# Patient Record
Sex: Female | Born: 1935
Health system: Southern US, Community
[De-identification: ages and names within clinical notes are randomized; demographics above are authoritative.]

## PROBLEM LIST (undated history)

## (undated) DIAGNOSIS — D649 Anemia, unspecified: Secondary | ICD-10-CM

## (undated) DIAGNOSIS — D259 Leiomyoma of uterus, unspecified: Secondary | ICD-10-CM

## (undated) DIAGNOSIS — M8580 Other specified disorders of bone density and structure, unspecified site: Secondary | ICD-10-CM

## (undated) DIAGNOSIS — I81 Portal vein thrombosis: Secondary | ICD-10-CM

## (undated) DIAGNOSIS — M1990 Unspecified osteoarthritis, unspecified site: Secondary | ICD-10-CM

## (undated) DIAGNOSIS — K859 Acute pancreatitis without necrosis or infection, unspecified: Secondary | ICD-10-CM

## (undated) DIAGNOSIS — K439 Ventral hernia without obstruction or gangrene: Secondary | ICD-10-CM

## (undated) DIAGNOSIS — I10 Essential (primary) hypertension: Secondary | ICD-10-CM

## (undated) DIAGNOSIS — Z87828 Personal history of other (healed) physical injury and trauma: Secondary | ICD-10-CM

## (undated) DIAGNOSIS — K5790 Diverticulosis of intestine, part unspecified, without perforation or abscess without bleeding: Secondary | ICD-10-CM

## (undated) DIAGNOSIS — E440 Moderate protein-calorie malnutrition: Secondary | ICD-10-CM

## (undated) HISTORY — DX: Portal vein thrombosis: I81

## (undated) HISTORY — DX: Acute pancreatitis without necrosis or infection, unspecified: K85.90

## (undated) HISTORY — DX: Diverticulosis of intestine, part unspecified, without perforation or abscess without bleeding: K57.90

## (undated) HISTORY — DX: Ventral hernia without obstruction or gangrene: K43.9

## (undated) HISTORY — DX: Leiomyoma of uterus, unspecified: D25.9

## (undated) HISTORY — PX: APPENDECTOMY: SHX54

## (undated) HISTORY — DX: Moderate protein-calorie malnutrition: E44.0

## (undated) HISTORY — DX: Essential (primary) hypertension: I10

## (undated) HISTORY — DX: Personal history of other (healed) physical injury and trauma: Z87.828

## (undated) HISTORY — DX: Unspecified osteoarthritis, unspecified site: M19.90

## (undated) HISTORY — DX: Other specified disorders of bone density and structure, unspecified site: M85.80

## (undated) HISTORY — PX: HERNIA REPAIR: SHX51

## (undated) HISTORY — DX: Anemia, unspecified: D64.9

---

## 2000-04-01 ENCOUNTER — Emergency Department (HOSPITAL_COMMUNITY): Admission: EM | Admit: 2000-04-01 | Discharge: 2000-04-01 | Payer: Self-pay | Admitting: Emergency Medicine

## 2002-08-29 ENCOUNTER — Encounter: Payer: Self-pay | Admitting: Internal Medicine

## 2002-08-29 ENCOUNTER — Inpatient Hospital Stay (HOSPITAL_COMMUNITY): Admission: EM | Admit: 2002-08-29 | Discharge: 2002-09-24 | Payer: Self-pay | Admitting: Emergency Medicine

## 2002-08-29 ENCOUNTER — Encounter: Payer: Self-pay | Admitting: Emergency Medicine

## 2002-08-30 ENCOUNTER — Encounter: Payer: Self-pay | Admitting: Internal Medicine

## 2002-08-31 ENCOUNTER — Encounter: Payer: Self-pay | Admitting: Internal Medicine

## 2002-09-03 ENCOUNTER — Encounter: Payer: Self-pay | Admitting: Internal Medicine

## 2002-09-04 ENCOUNTER — Encounter: Payer: Self-pay | Admitting: Internal Medicine

## 2002-09-06 ENCOUNTER — Encounter: Payer: Self-pay | Admitting: Internal Medicine

## 2002-09-07 ENCOUNTER — Encounter: Payer: Self-pay | Admitting: Internal Medicine

## 2002-09-10 ENCOUNTER — Encounter: Payer: Self-pay | Admitting: General Surgery

## 2002-09-11 ENCOUNTER — Encounter: Payer: Self-pay | Admitting: General Surgery

## 2002-09-15 ENCOUNTER — Encounter: Payer: Self-pay | Admitting: Internal Medicine

## 2002-09-16 ENCOUNTER — Encounter: Payer: Self-pay | Admitting: Internal Medicine

## 2002-09-24 ENCOUNTER — Inpatient Hospital Stay: Admission: RE | Admit: 2002-09-24 | Discharge: 2002-09-28 | Payer: Self-pay | Admitting: Internal Medicine

## 2002-09-28 ENCOUNTER — Ambulatory Visit (HOSPITAL_COMMUNITY): Admission: RE | Admit: 2002-09-28 | Discharge: 2002-09-28 | Payer: Self-pay | Admitting: Internal Medicine

## 2002-09-28 ENCOUNTER — Encounter: Payer: Self-pay | Admitting: Internal Medicine

## 2002-10-01 ENCOUNTER — Encounter: Admission: RE | Admit: 2002-10-01 | Discharge: 2002-10-01 | Payer: Self-pay | Admitting: Internal Medicine

## 2002-10-08 ENCOUNTER — Encounter: Admission: RE | Admit: 2002-10-08 | Discharge: 2002-10-08 | Payer: Self-pay | Admitting: Internal Medicine

## 2002-10-22 ENCOUNTER — Encounter: Admission: RE | Admit: 2002-10-22 | Discharge: 2002-10-22 | Payer: Self-pay | Admitting: Internal Medicine

## 2002-11-02 ENCOUNTER — Emergency Department (HOSPITAL_COMMUNITY): Admission: EM | Admit: 2002-11-02 | Discharge: 2002-11-02 | Payer: Self-pay

## 2002-11-02 ENCOUNTER — Encounter: Admission: RE | Admit: 2002-11-02 | Discharge: 2002-11-02 | Payer: Self-pay | Admitting: Infectious Diseases

## 2002-11-04 ENCOUNTER — Encounter: Admission: RE | Admit: 2002-11-04 | Discharge: 2002-11-04 | Payer: Self-pay | Admitting: Internal Medicine

## 2002-11-16 ENCOUNTER — Encounter: Admission: RE | Admit: 2002-11-16 | Discharge: 2002-11-16 | Payer: Self-pay | Admitting: Internal Medicine

## 2002-12-14 ENCOUNTER — Encounter: Admission: RE | Admit: 2002-12-14 | Discharge: 2002-12-14 | Payer: Self-pay | Admitting: Internal Medicine

## 2003-01-11 ENCOUNTER — Encounter: Admission: RE | Admit: 2003-01-11 | Discharge: 2003-01-11 | Payer: Self-pay | Admitting: Internal Medicine

## 2003-02-08 ENCOUNTER — Encounter: Admission: RE | Admit: 2003-02-08 | Discharge: 2003-02-08 | Payer: Self-pay | Admitting: Internal Medicine

## 2003-03-01 ENCOUNTER — Encounter: Admission: RE | Admit: 2003-03-01 | Discharge: 2003-03-01 | Payer: Self-pay | Admitting: Infectious Diseases

## 2003-04-05 ENCOUNTER — Encounter: Admission: RE | Admit: 2003-04-05 | Discharge: 2003-04-05 | Payer: Self-pay | Admitting: Infectious Diseases

## 2003-04-26 ENCOUNTER — Encounter: Admission: RE | Admit: 2003-04-26 | Discharge: 2003-04-26 | Payer: Self-pay | Admitting: Internal Medicine

## 2004-06-28 ENCOUNTER — Encounter (INDEPENDENT_AMBULATORY_CARE_PROVIDER_SITE_OTHER): Payer: Self-pay | Admitting: Infectious Diseases

## 2005-05-03 ENCOUNTER — Ambulatory Visit: Payer: Self-pay | Admitting: Internal Medicine

## 2005-07-11 ENCOUNTER — Ambulatory Visit: Payer: Self-pay | Admitting: Internal Medicine

## 2005-07-31 ENCOUNTER — Ambulatory Visit: Payer: Self-pay | Admitting: Internal Medicine

## 2006-06-04 ENCOUNTER — Encounter (INDEPENDENT_AMBULATORY_CARE_PROVIDER_SITE_OTHER): Payer: Self-pay | Admitting: Infectious Diseases

## 2006-06-04 DIAGNOSIS — I10 Essential (primary) hypertension: Secondary | ICD-10-CM

## 2006-07-23 DIAGNOSIS — I81 Portal vein thrombosis: Secondary | ICD-10-CM

## 2006-07-23 DIAGNOSIS — K439 Ventral hernia without obstruction or gangrene: Secondary | ICD-10-CM | POA: Insufficient documentation

## 2006-07-23 DIAGNOSIS — K630 Abscess of intestine: Secondary | ICD-10-CM

## 2006-07-23 DIAGNOSIS — M949 Disorder of cartilage, unspecified: Secondary | ICD-10-CM

## 2006-07-23 DIAGNOSIS — A419 Sepsis, unspecified organism: Secondary | ICD-10-CM

## 2006-07-23 DIAGNOSIS — M899 Disorder of bone, unspecified: Secondary | ICD-10-CM

## 2012-11-04 ENCOUNTER — Telehealth: Payer: Self-pay | Admitting: Physician Assistant

## 2012-11-04 MED ORDER — HYDROCHLOROTHIAZIDE 25 MG PO TABS: 25.0000 mg | ORAL_TABLET | Freq: Every day | ORAL | Status: DC

## 2012-11-04 NOTE — Telephone Encounter (Signed)
Med refill #30 only.  Pt NTBS.  Called pt home and left mess to make appt.

## 2012-12-01 ENCOUNTER — Telehealth: Payer: Self-pay | Admitting: Physician Assistant

## 2012-12-02 ENCOUNTER — Telehealth: Payer: Self-pay | Admitting: Physician Assistant

## 2012-12-02 MED ORDER — HYDROCHLOROTHIAZIDE 25 MG PO TABS: 25.0000 mg | ORAL_TABLET | Freq: Every day | ORAL | Status: DC

## 2012-12-02 NOTE — Telephone Encounter (Signed)
HCTZ 25mg  take one tablet by mouth every day. #30 last refill 11/04/12 pt has appt next week i went ahead and refilled prescription

## 2012-12-05 ENCOUNTER — Encounter: Payer: Self-pay | Admitting: Physician Assistant

## 2012-12-18 ENCOUNTER — Ambulatory Visit (INDEPENDENT_AMBULATORY_CARE_PROVIDER_SITE_OTHER): Payer: Medicare HMO | Admitting: Physician Assistant

## 2012-12-18 ENCOUNTER — Other Ambulatory Visit: Payer: Self-pay | Admitting: Physician Assistant

## 2012-12-18 ENCOUNTER — Encounter: Payer: Self-pay | Admitting: Physician Assistant

## 2012-12-18 VITALS — BP 160/82 | HR 76 | Temp 98.3°F | Resp 18 | Ht 62.25 in | Wt 194.0 lb

## 2012-12-18 DIAGNOSIS — M899 Disorder of bone, unspecified: Secondary | ICD-10-CM

## 2012-12-18 DIAGNOSIS — I10 Essential (primary) hypertension: Secondary | ICD-10-CM

## 2012-12-18 DIAGNOSIS — M170 Bilateral primary osteoarthritis of knee: Secondary | ICD-10-CM

## 2012-12-18 DIAGNOSIS — M8580 Other specified disorders of bone density and structure, unspecified site: Secondary | ICD-10-CM | POA: Insufficient documentation

## 2012-12-18 DIAGNOSIS — M1710 Unilateral primary osteoarthritis, unspecified knee: Secondary | ICD-10-CM

## 2012-12-18 DIAGNOSIS — E785 Hyperlipidemia, unspecified: Secondary | ICD-10-CM

## 2012-12-18 DIAGNOSIS — Z2911 Encounter for prophylactic immunotherapy for respiratory syncytial virus (RSV): Secondary | ICD-10-CM

## 2012-12-18 DIAGNOSIS — R5383 Other fatigue: Secondary | ICD-10-CM

## 2012-12-18 DIAGNOSIS — E559 Vitamin D deficiency, unspecified: Secondary | ICD-10-CM

## 2012-12-18 DIAGNOSIS — Z23 Encounter for immunization: Secondary | ICD-10-CM

## 2012-12-18 LAB — CBC WITH DIFFERENTIAL/PLATELET
Basophils Absolute: 0 10*3/uL (ref 0.0–0.1)
Eosinophils Absolute: 0.3 10*3/uL (ref 0.0–0.7)
Eosinophils Relative: 5 % (ref 0–5)
MCH: 32.3 pg (ref 26.0–34.0)
MCHC: 34.6 g/dL (ref 30.0–36.0)
MCV: 93.5 fL (ref 78.0–100.0)
Platelets: 263 10*3/uL (ref 150–400)
RDW: 12.9 % (ref 11.5–15.5)

## 2012-12-18 LAB — COMPLETE METABOLIC PANEL WITH GFR
ALT: 23 U/L (ref 0–35)
AST: 21 U/L (ref 0–37)
Creat: 0.71 mg/dL (ref 0.50–1.10)
GFR, Est African American: >89 mL/min
Total Bilirubin: 0.8 mg/dL (ref 0.3–1.2)

## 2012-12-18 LAB — LIPID PANEL
Cholesterol: 152 mg/dL (ref 0–200)
HDL: 37 mg/dL — ABNORMAL LOW (ref >39)
LDL Cholesterol: 93 mg/dL (ref 0–99)
Triglycerides: 112 mg/dL (ref <150)
VLDL: 22 mg/dL (ref 0–40)

## 2012-12-18 MED ORDER — HYDROCHLOROTHIAZIDE 25 MG PO TABS: 25.0000 mg | ORAL_TABLET | Freq: Every day | ORAL | Status: DC

## 2012-12-18 MED ORDER — LISINOPRIL 10 MG PO TABS: 10.0000 mg | ORAL_TABLET | Freq: Every day | ORAL | Status: DC
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
## 2012-12-19 LAB — HEMOGLOBIN A1C: Mean Plasma Glucose: 126 mg/dL — ABNORMAL HIGH (ref <117)

## 2012-12-22 ENCOUNTER — Telehealth: Payer: Self-pay | Admitting: Physician Assistant

## 2012-12-22 NOTE — Telephone Encounter (Signed)
Pt called and told about lab results and given provider directions.  Also told to make 3 mth follow up appt.

## 2012-12-22 NOTE — Telephone Encounter (Signed)
Message copied by Donne Anon on Mon Dec 22, 2012  3:32 PM ------      Message from: Allayne Butcher      Created: Fri Dec 19, 2012  5:41 PM       Her Sugar was a little high this time (no h/o this). Tell her to decrease carbohydrates- Sweets, Potatoes, Pasta, Rice, Fruit, Juice, etc. MUST come for f/u OV 3 months so I can recheck lab and will give her Carbohydrate information then. If A1C does not decrease, will also require cholesterol med.      Rest of labs are all normal. -CBC, CMET, TSH, Vit D ------

## 2013-01-06 NOTE — Telephone Encounter (Signed)
Was done 12/01/12

## 2013-07-30 ENCOUNTER — Encounter: Payer: Self-pay | Admitting: Family Medicine

## 2013-11-10 ENCOUNTER — Other Ambulatory Visit: Payer: Self-pay | Admitting: Physician Assistant

## 2013-11-10 ENCOUNTER — Encounter: Payer: Self-pay | Admitting: Family Medicine

## 2013-11-10 DIAGNOSIS — I10 Essential (primary) hypertension: Secondary | ICD-10-CM

## 2013-11-10 NOTE — Telephone Encounter (Signed)
Medication refill for one time only.  Patient needs to be seen.  Letter sent for patient to call and schedule 

## 2014-01-06 ENCOUNTER — Other Ambulatory Visit: Payer: Self-pay | Admitting: Physician Assistant

## 2014-01-06 ENCOUNTER — Telehealth: Payer: Self-pay | Admitting: Physician Assistant

## 2014-01-06 NOTE — Telephone Encounter (Signed)
[phone]  Pharmacy is [facility]  Pt is needing a refill on lisinopril (PRINIVIL,ZESTRIL) 10 MG tablet

## 2014-01-06 NOTE — Telephone Encounter (Signed)
Rf already done on escribe from pharmacy

## 2014-01-06 NOTE — Telephone Encounter (Signed)
Refill appropriate and filled per protocol. 

## 2014-01-25 ENCOUNTER — Telehealth: Payer: Self-pay | Admitting: Physician Assistant

## 2014-01-25 ENCOUNTER — Other Ambulatory Visit: Payer: Self-pay | Admitting: Physician Assistant

## 2014-01-25 NOTE — Telephone Encounter (Signed)
[phone]  Pt is needing a refill on hydrochlorothiazide (HYDRODIURIL) 25 MG tablet  Pharmacy [facility]   ---pt states she will make an apt but there is a lot going on with her family she is needing to get all of that taken care of.

## 2014-01-25 NOTE — Telephone Encounter (Signed)
Call placed to patient. LM on VM.   Letter sent.

## 2014-01-25 NOTE — Telephone Encounter (Signed)
?  ok to refill last ov 12/18/2013 stated on medlist "do not refill until pt is seen".

## 2014-01-25 NOTE — Telephone Encounter (Signed)
Patient returned call and made aware.   Appointment scheduled for 02/17/2014 @ 8:30am.

## 2014-01-25 NOTE — Telephone Encounter (Signed)
Medication filled x1 with no refills.   Requires office visit before any further refills can be given.   Letter sent.  

## 2014-01-25 NOTE — Telephone Encounter (Signed)
Her last office visit in this office was May 2014. At that visit an additional blood pressure medication was added because her blood pressure was uncontrolled. She has had no followup since that time. She is 78 years old. It is not safe for [place] to continue prescribing medications without monitoring them at all. Reviewed that at the last note she was having a lot of stress with taking care of family members. However, if she is not taking care of her own health, she is not going to be any good to herself or anyone else. She needs to find a way to take 1 or 2 hours out of her time to come in for an office visit and lab work. IF she schedules an office visit to be in the next few weeks, THEN  we can give her one month supply of medications to hold her until that. NO REFILLS!

## 2014-02-17 ENCOUNTER — Ambulatory Visit: Payer: Self-pay | Admitting: Physician Assistant

## 2014-02-23 ENCOUNTER — Telehealth: Payer: Self-pay | Admitting: Physician Assistant

## 2014-02-23 NOTE — Telephone Encounter (Signed)
Patient is calling to see if she needs to fast on her appt on 08-10 with mary beth and also wants to know if we think she has enough blood pressure medication until her appointment  Please call her at [phone]

## 2014-02-24 ENCOUNTER — Ambulatory Visit: Payer: Medicare HMO | Admitting: Physician Assistant

## 2014-02-24 ENCOUNTER — Other Ambulatory Visit: Payer: Self-pay | Admitting: Physician Assistant

## 2014-03-01 ENCOUNTER — Telehealth: Payer: Self-pay | Admitting: Physician Assistant

## 2014-03-01 ENCOUNTER — Other Ambulatory Visit: Payer: Self-pay | Admitting: Physician Assistant

## 2014-03-01 ENCOUNTER — Ambulatory Visit: Payer: Medicare HMO | Admitting: Physician Assistant

## 2014-03-01 NOTE — Telephone Encounter (Signed)
Ok to refill 

## 2014-03-01 NOTE — Telephone Encounter (Signed)
Last office visit was greater than one year ago. However, patient has appointment scheduled for 03/03/14. OK to refill to hold her over to the appointment. Give #30 with 0 additional refills.

## 2014-03-01 NOTE — Telephone Encounter (Signed)
[phone]   PT is needing a refill on hydrochlorothiazide (HYDRODIURIL) 25 MG tablet   Pharmacy [facility]   -pt had to reschedule to Wednesday 8/12 but she just took her last pill this morning can we please get some more sent to the pharmacy.

## 2014-03-01 NOTE — Telephone Encounter (Signed)
Prescription called in to pharmacy, 30 no refills, pt has appt on 03/03/14

## 2014-03-01 NOTE — Telephone Encounter (Signed)
I Already responded to this in a different section of InBasket.  Responded that we can give her #30+0 additional refills

## 2014-03-03 ENCOUNTER — Telehealth: Payer: Self-pay | Admitting: Physician Assistant

## 2014-03-03 ENCOUNTER — Encounter: Payer: Self-pay | Admitting: Physician Assistant

## 2014-03-03 ENCOUNTER — Ambulatory Visit (INDEPENDENT_AMBULATORY_CARE_PROVIDER_SITE_OTHER): Payer: Medicare HMO | Admitting: Physician Assistant

## 2014-03-03 VITALS — BP 140/70 | HR 84 | Temp 98.1°F | Resp 18 | Ht 62.25 in | Wt 200.0 lb

## 2014-03-03 DIAGNOSIS — Z1211 Encounter for screening for malignant neoplasm of colon: Secondary | ICD-10-CM

## 2014-03-03 DIAGNOSIS — I10 Essential (primary) hypertension: Secondary | ICD-10-CM

## 2014-03-03 DIAGNOSIS — M899 Disorder of bone, unspecified: Secondary | ICD-10-CM

## 2014-03-03 DIAGNOSIS — M8580 Other specified disorders of bone density and structure, unspecified site: Secondary | ICD-10-CM

## 2014-03-03 DIAGNOSIS — E669 Obesity, unspecified: Secondary | ICD-10-CM

## 2014-03-03 DIAGNOSIS — Z1212 Encounter for screening for malignant neoplasm of rectum: Secondary | ICD-10-CM

## 2014-03-03 DIAGNOSIS — R739 Hyperglycemia, unspecified: Secondary | ICD-10-CM | POA: Insufficient documentation

## 2014-03-03 DIAGNOSIS — M949 Disorder of cartilage, unspecified: Secondary | ICD-10-CM

## 2014-03-03 DIAGNOSIS — Z23 Encounter for immunization: Secondary | ICD-10-CM

## 2014-03-03 DIAGNOSIS — R7309 Other abnormal glucose: Secondary | ICD-10-CM

## 2014-03-03 LAB — BASIC METABOLIC PANEL WITH GFR
BUN: 18 mg/dL (ref 6–23)
CO2: 27 mEq/L (ref 19–32)
CREATININE: 0.85 mg/dL (ref 0.50–1.10)
Calcium: 9.4 mg/dL (ref 8.4–10.5)
Chloride: 99 mEq/L (ref 96–112)
GFR, EST NON AFRICAN AMERICAN: 67 mL/min
GFR, Est African American: 77 mL/min
GLUCOSE: 101 mg/dL — AB (ref 70–99)
Potassium: 4.4 mEq/L (ref 3.5–5.3)
Sodium: 136 mEq/L (ref 135–145)

## 2014-03-03 LAB — HEMOGLOBIN A1C
Hgb A1c MFr Bld: 6.2 % — ABNORMAL HIGH (ref <5.7)
Mean Plasma Glucose: 131 mg/dL — ABNORMAL HIGH (ref <117)

## 2014-03-03 NOTE — Telephone Encounter (Signed)
Patient is calling to ask how many calories she can have per day she said she forgot to ask mary beth

## 2014-03-03 NOTE — Telephone Encounter (Signed)
Contacted pt and she was wanting to know how much salt intake and calories she needs to consume, stated that MaryBeth mentioned it, I looked at office visit notes and nothing was mentioned about her salt intake or calories. Pt states she is going to watch what she eats and will call back if any more questjons
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
## 2014-03-04 NOTE — Telephone Encounter (Signed)
Med refilled.

## 2014-03-08 ENCOUNTER — Telehealth: Payer: Self-pay | Admitting: Physician Assistant

## 2014-03-08 MED ORDER — HYDROCHLOROTHIAZIDE 25 MG PO TABS: ORAL_TABLET | ORAL | Status: DC

## 2014-03-08 MED ORDER — LISINOPRIL 10 MG PO TABS: ORAL_TABLET | ORAL | Status: DC

## 2014-03-08 NOTE — Telephone Encounter (Signed)
Refill appropriate and filled per protocol. 

## 2014-03-08 NOTE — Telephone Encounter (Signed)
Patient is calling to let us know that she would like 90 day supply on her blood pressure medications lisinopril and hydrochlorothiazide if possible

## 2014-04-07 NOTE — Progress Notes (Signed)
Pt was to do HemeOccult x 3--These are now showing up in "Overdue Results" --Please call pt and remind her to do these.  If she is NOT going to do these, let me know so I can remove them from my inbasket.

## 2014-04-13 NOTE — Progress Notes (Signed)
In my"Overdue Results" part of Inbasket I am seeing that this patient has not come in to do her fecal occult test.  Call patient to remind her to do these. If she plans not to do these, please let me know so I can cancel the order.

## 2014-04-27 ENCOUNTER — Ambulatory Visit (INDEPENDENT_AMBULATORY_CARE_PROVIDER_SITE_OTHER): Payer: Medicare HMO | Admitting: Family Medicine

## 2014-04-27 DIAGNOSIS — Z23 Encounter for immunization: Secondary | ICD-10-CM

## 2014-04-28 ENCOUNTER — Telehealth: Payer: Self-pay | Admitting: *Deleted

## 2014-04-28 NOTE — Telephone Encounter (Signed)
Received call from patient.   Reports that she was given the influenza and pneumococcal vaccinations on 04/27/2014.  States that both her arms at injection site are red and tender.   Advised that injection site soreness is a normal side effect and that she can use OTC NSAID's and ICE for inflammation and pain relief.   Advised that if areas are not any better in 48 hours to contact our office.

## 2014-06-03 ENCOUNTER — Ambulatory Visit: Payer: Medicare HMO | Admitting: Physician Assistant

## 2014-06-14 ENCOUNTER — Other Ambulatory Visit: Payer: Self-pay | Admitting: Physician Assistant

## 2014-06-14 NOTE — Telephone Encounter (Signed)
Medication refilled per protocol. 

## 2014-06-16 ENCOUNTER — Other Ambulatory Visit: Payer: Self-pay | Admitting: Physician Assistant

## 2014-06-16 NOTE — Telephone Encounter (Signed)
Medication refilled per protocol. 

## 2014-06-24 ENCOUNTER — Ambulatory Visit: Payer: Medicare HMO | Admitting: Physician Assistant

## 2014-06-28 ENCOUNTER — Ambulatory Visit: Payer: Medicare HMO | Admitting: Physician Assistant

## 2014-09-12 ENCOUNTER — Other Ambulatory Visit: Payer: Self-pay | Admitting: Physician Assistant

## 2014-09-13 ENCOUNTER — Other Ambulatory Visit: Payer: Self-pay | Admitting: Family Medicine

## 2014-09-13 ENCOUNTER — Other Ambulatory Visit: Payer: Self-pay | Admitting: Physician Assistant

## 2014-09-13 ENCOUNTER — Telehealth: Payer: Self-pay | Admitting: Physician Assistant

## 2014-09-13 ENCOUNTER — Encounter: Payer: Self-pay | Admitting: Family Medicine

## 2014-09-13 MED ORDER — HYDROCHLOROTHIAZIDE 25 MG PO TABS: 25.0000 mg | ORAL_TABLET | Freq: Every day | ORAL | Status: DC

## 2014-09-13 MED ORDER — LISINOPRIL 10 MG PO TABS: 10.0000 mg | ORAL_TABLET | Freq: Every day | ORAL | Status: DC

## 2014-09-13 NOTE — Telephone Encounter (Signed)
Medication refill for one time only.  Patient needs to be seen.  Letter sent for patient to call and schedule 

## 2014-09-13 NOTE — Telephone Encounter (Signed)
[phone] [facility] [address] Pt is needing refill on   hydrochlorothiazide (HYDRODIURIL) 25 MG tablet   lisinopril (PRINIVIL,ZESTRIL) 10 MG tablet

## 2014-09-13 NOTE — Telephone Encounter (Signed)
Refill appropriate and filled per protocol. 

## 2014-09-13 NOTE — Telephone Encounter (Signed)
error 

## 2014-09-17 ENCOUNTER — Encounter: Payer: Self-pay | Admitting: Family Medicine

## 2014-09-17 DIAGNOSIS — Z1231 Encounter for screening mammogram for malignant neoplasm of breast: Secondary | ICD-10-CM | POA: Diagnosis not present

## 2014-09-17 LAB — HM MAMMOGRAPHY: HM MAMMO: NORMAL

## 2014-09-27 ENCOUNTER — Telehealth: Payer: Self-pay | Admitting: Physician Assistant

## 2014-09-27 NOTE — Telephone Encounter (Signed)
Called pt left message Mammo report was rec'd and was normal

## 2014-09-27 NOTE — Telephone Encounter (Signed)
Patient is calling to see if we have her mammogram results  [phone]

## 2014-11-04 ENCOUNTER — Ambulatory Visit: Payer: Medicare HMO | Admitting: Physician Assistant

## 2014-11-10 ENCOUNTER — Encounter: Payer: Self-pay | Admitting: Physician Assistant

## 2014-11-10 ENCOUNTER — Ambulatory Visit (INDEPENDENT_AMBULATORY_CARE_PROVIDER_SITE_OTHER): Payer: Commercial Managed Care - HMO | Admitting: Physician Assistant

## 2014-11-10 VITALS — BP 144/96 | HR 80 | Temp 98.4°F | Resp 18 | Wt 186.0 lb

## 2014-11-10 DIAGNOSIS — I10 Essential (primary) hypertension: Secondary | ICD-10-CM | POA: Diagnosis not present

## 2014-11-10 DIAGNOSIS — R739 Hyperglycemia, unspecified: Secondary | ICD-10-CM | POA: Diagnosis not present

## 2014-11-10 DIAGNOSIS — M8580 Other specified disorders of bone density and structure, unspecified site: Secondary | ICD-10-CM | POA: Diagnosis not present

## 2014-11-10 DIAGNOSIS — E669 Obesity, unspecified: Secondary | ICD-10-CM

## 2014-11-10 DIAGNOSIS — M170 Bilateral primary osteoarthritis of knee: Secondary | ICD-10-CM

## 2014-11-10 DIAGNOSIS — R7309 Other abnormal glucose: Secondary | ICD-10-CM | POA: Diagnosis not present

## 2014-11-10 LAB — COMPLETE METABOLIC PANEL WITH GFR
ALBUMIN: 4.5 g/dL (ref 3.5–5.2)
ALT: 19 U/L (ref 0–35)
AST: 19 U/L (ref 0–37)
Alkaline Phosphatase: 54 U/L (ref 39–117)
BUN: 22 mg/dL (ref 6–23)
CO2: 24 meq/L (ref 19–32)
Calcium: 9.6 mg/dL (ref 8.4–10.5)
Chloride: 98 mEq/L (ref 96–112)
Creat: 0.79 mg/dL (ref 0.50–1.10)
GFR, EST AFRICAN AMERICAN: 84 mL/min
GFR, Est Non African American: 72 mL/min
GLUCOSE: 99 mg/dL (ref 70–99)
POTASSIUM: 4 meq/L (ref 3.5–5.3)
SODIUM: 136 meq/L (ref 135–145)
TOTAL PROTEIN: 7.5 g/dL (ref 6.0–8.3)
Total Bilirubin: 0.8 mg/dL (ref 0.2–1.2)

## 2014-11-10 LAB — HEMOGLOBIN A1C
Hgb A1c MFr Bld: 6.3 % — ABNORMAL HIGH (ref <5.7)
MEAN PLASMA GLUCOSE: 134 mg/dL — AB (ref <117)

## 2014-11-10 MED ORDER — LISINOPRIL 20 MG PO TABS: 20.0000 mg | ORAL_TABLET | Freq: Every day | ORAL | Status: DC

## 2014-11-10 MED ORDER — TRAMADOL HCL 50 MG PO TABS: ORAL_TABLET | ORAL | Status: DC
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
## 2014-11-11 ENCOUNTER — Telehealth: Payer: Self-pay | Admitting: Physician Assistant

## 2014-11-11 NOTE — Telephone Encounter (Signed)
Pt was confused about her medications.  Reviewed all her medications with her to be sure taking all correctly.  Has 2 week follow up appt.

## 2014-11-11 NOTE — Telephone Encounter (Signed)
[phone]  Patient has questions about her lisinopril and her dosage

## 2014-11-25 ENCOUNTER — Ambulatory Visit: Payer: Commercial Managed Care - HMO | Admitting: Physician Assistant

## 2014-11-26 ENCOUNTER — Telehealth: Payer: Self-pay | Admitting: Physician Assistant

## 2014-11-26 NOTE — Telephone Encounter (Signed)
Patient is calling to see if she can drink grapefruit juice with taking the meds that she is taking  [phone]

## 2014-11-26 NOTE — Telephone Encounter (Signed)
Pt called and told no contraindications for her with current medications

## 2014-12-07 ENCOUNTER — Other Ambulatory Visit: Payer: Self-pay | Admitting: Family Medicine

## 2014-12-07 ENCOUNTER — Encounter: Payer: Self-pay | Admitting: Physician Assistant

## 2014-12-22 ENCOUNTER — Telehealth: Payer: Self-pay | Admitting: *Deleted

## 2014-12-22 DIAGNOSIS — M170 Bilateral primary osteoarthritis of knee: Secondary | ICD-10-CM

## 2014-12-22 MED ORDER — TRAMADOL HCL 50 MG PO TABS: ORAL_TABLET | ORAL | Status: DC

## 2014-12-22 NOTE — Telephone Encounter (Signed)
Script phoned in

## 2014-12-22 NOTE — Telephone Encounter (Signed)
Pt calling needing refill on Tramadol 50mg    [facility] [address]

## 2014-12-22 NOTE — Telephone Encounter (Signed)
?   OK to Refill  - las rf 11/10/14 #30

## 2014-12-22 NOTE — Telephone Encounter (Signed)
Approved for #[id number] 

## 2015-01-25 ENCOUNTER — Other Ambulatory Visit: Payer: Self-pay | Admitting: Physician Assistant

## 2015-01-25 ENCOUNTER — Telehealth: Payer: Self-pay | Admitting: Physician Assistant

## 2015-01-25 NOTE — Telephone Encounter (Signed)
LRF 12/22/14 #60  LOV 11/11/14  OK refill?

## 2015-01-25 NOTE — Telephone Encounter (Signed)
rx called in from [facility]

## 2015-01-25 NOTE — Telephone Encounter (Signed)
Approved. # 60 + 0. 

## 2015-01-25 NOTE — Telephone Encounter (Signed)
rx called in see phone note for provider approval

## 2015-01-25 NOTE — Telephone Encounter (Signed)
[phone] PT is needing a refill on traMADol (ULTRAM) 50 MG tablet

## 2015-02-14 ENCOUNTER — Other Ambulatory Visit: Payer: Self-pay | Admitting: Physician Assistant

## 2015-02-14 MED ORDER — TRAMADOL HCL 50 MG PO TABS: ORAL_TABLET | ORAL | Status: DC

## 2015-02-14 NOTE — Telephone Encounter (Signed)
LRF Tramadol 01/25/15 # 60  LOV 10/27/14  OK refill?

## 2015-02-14 NOTE — Telephone Encounter (Signed)
Increase to #90 --- + 1 refill

## 2015-02-14 NOTE — Telephone Encounter (Signed)
Medication refilled per protocol. 

## 2015-02-14 NOTE — Telephone Encounter (Signed)
I already addressed this somewhere earlier this morning--- can increase quantity to #90 and give 1 additional refill

## 2015-02-14 NOTE — Telephone Encounter (Signed)
LRF 01/25/15 #60  LOV 11/10/14  OK refill?

## 2015-02-14 NOTE — Telephone Encounter (Signed)
Duplicate request, already called in 

## 2015-05-10 ENCOUNTER — Ambulatory Visit (INDEPENDENT_AMBULATORY_CARE_PROVIDER_SITE_OTHER): Payer: Commercial Managed Care - HMO | Admitting: Family Medicine

## 2015-05-10 DIAGNOSIS — Z23 Encounter for immunization: Secondary | ICD-10-CM | POA: Diagnosis not present

## 2015-05-14 ENCOUNTER — Other Ambulatory Visit: Payer: Self-pay | Admitting: Physician Assistant

## 2015-05-16 ENCOUNTER — Telehealth: Payer: Self-pay | Admitting: Physician Assistant

## 2015-05-16 NOTE — Telephone Encounter (Signed)
Approved #[id number] 

## 2015-05-16 NOTE — Telephone Encounter (Signed)
Medication refilled per protocol. 

## 2015-05-16 NOTE — Telephone Encounter (Signed)
LRF 02/14/15 #90 + 1.  LOV 11/10/14.  OK refill?

## 2015-05-16 NOTE — Telephone Encounter (Signed)
Told pt before we can put her on something stronger, at her age, we will need to see in office.  LOV 11/10/14.  Will call back to make appt when husband can bring her.

## 2015-05-16 NOTE — Telephone Encounter (Signed)
Pt is calling to see if we can give her a medication that is stronger than Tramadol for pain and swelling in her knees. Please call [phone]

## 2015-06-23 ENCOUNTER — Encounter: Payer: Self-pay | Admitting: Physician Assistant

## 2015-06-23 ENCOUNTER — Ambulatory Visit (INDEPENDENT_AMBULATORY_CARE_PROVIDER_SITE_OTHER): Payer: Commercial Managed Care - HMO | Admitting: Physician Assistant

## 2015-06-23 VITALS — BP 144/72 | HR 92 | Temp 98.5°F | Resp 18 | Wt 172.0 lb

## 2015-06-23 DIAGNOSIS — M949 Disorder of cartilage, unspecified: Secondary | ICD-10-CM | POA: Diagnosis not present

## 2015-06-23 DIAGNOSIS — E669 Obesity, unspecified: Secondary | ICD-10-CM

## 2015-06-23 DIAGNOSIS — I10 Essential (primary) hypertension: Secondary | ICD-10-CM | POA: Diagnosis not present

## 2015-06-23 DIAGNOSIS — M170 Bilateral primary osteoarthritis of knee: Secondary | ICD-10-CM | POA: Diagnosis not present

## 2015-06-23 DIAGNOSIS — R7309 Other abnormal glucose: Secondary | ICD-10-CM | POA: Diagnosis not present

## 2015-06-23 DIAGNOSIS — M8580 Other specified disorders of bone density and structure, unspecified site: Secondary | ICD-10-CM

## 2015-06-23 DIAGNOSIS — M899 Disorder of bone, unspecified: Secondary | ICD-10-CM

## 2015-06-23 DIAGNOSIS — R739 Hyperglycemia, unspecified: Secondary | ICD-10-CM | POA: Diagnosis not present

## 2015-06-23 LAB — COMPLETE METABOLIC PANEL WITH GFR
ALT: 19 U/L (ref 6–29)
AST: 16 U/L (ref 10–35)
Albumin: 3.9 g/dL (ref 3.6–5.1)
Alkaline Phosphatase: 59 U/L (ref 33–130)
BILIRUBIN TOTAL: 0.6 mg/dL (ref 0.2–1.2)
BUN: 12 mg/dL (ref 7–25)
CHLORIDE: 98 mmol/L (ref 98–110)
CO2: 26 mmol/L (ref 20–31)
Calcium: 9.4 mg/dL (ref 8.6–10.4)
Creat: 0.8 mg/dL (ref 0.60–0.93)
GFR, EST AFRICAN AMERICAN: 82 mL/min (ref >=60)
GFR, EST NON AFRICAN AMERICAN: 71 mL/min (ref >=60)
Glucose, Bld: 169 mg/dL — ABNORMAL HIGH (ref 70–99)
Potassium: 4.1 mmol/L (ref 3.5–5.3)
Sodium: 134 mmol/L — ABNORMAL LOW (ref 135–146)
TOTAL PROTEIN: 6.9 g/dL (ref 6.1–8.1)

## 2015-06-23 LAB — HEMOGLOBIN A1C
Hgb A1c MFr Bld: 6.1 % — ABNORMAL HIGH (ref <5.7)
MEAN PLASMA GLUCOSE: 128 mg/dL — AB (ref <117)
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
## 2015-07-04 ENCOUNTER — Other Ambulatory Visit: Payer: Self-pay | Admitting: Family Medicine

## 2015-07-04 ENCOUNTER — Ambulatory Visit
Admission: RE | Admit: 2015-07-04 | Discharge: 2015-07-04 | Disposition: A | Discharge status: Home / Self Care | Payer: Commercial Managed Care - HMO | Source: Ambulatory Visit | Attending: Physician Assistant | Admitting: Physician Assistant

## 2015-07-04 ENCOUNTER — Ambulatory Visit (INDEPENDENT_AMBULATORY_CARE_PROVIDER_SITE_OTHER): Payer: Commercial Managed Care - HMO | Admitting: Physician Assistant

## 2015-07-04 ENCOUNTER — Encounter: Payer: Self-pay | Admitting: Physician Assistant

## 2015-07-04 VITALS — BP 112/60 | HR 80 | Temp 98.1°F | Resp 18 | Wt 172.0 lb

## 2015-07-04 DIAGNOSIS — R739 Hyperglycemia, unspecified: Secondary | ICD-10-CM

## 2015-07-04 DIAGNOSIS — M25561 Pain in right knee: Secondary | ICD-10-CM

## 2015-07-04 DIAGNOSIS — M25562 Pain in left knee: Secondary | ICD-10-CM | POA: Diagnosis not present

## 2015-07-04 DIAGNOSIS — M1712 Unilateral primary osteoarthritis, left knee: Secondary | ICD-10-CM | POA: Diagnosis not present

## 2015-07-04 DIAGNOSIS — M170 Bilateral primary osteoarthritis of knee: Secondary | ICD-10-CM

## 2015-07-04 DIAGNOSIS — M1711 Unilateral primary osteoarthritis, right knee: Secondary | ICD-10-CM | POA: Diagnosis not present

## 2015-07-04 NOTE — Progress Notes (Signed)
Patient ID: LYNNAYA LAPPIN MRN: [id number], DOB: [birth year], 79 y.o. Date of Encounter: 07/04/2015, 10:11 AM    Chief Complaint:  Chief Complaint  Patient presents with  . has questions about diet and sugar     HPI: 79 y.o. year old white female here to discuss above.   She brings in 2 pieces of paper that she says she was given in the past. One of them-to do with reading food labels. The second sheet has the different food groups and a list of foods to eat ineachof these groups anda list of foods to limit/avoid in each ofthese groups.  She says that she wants to make sure that she is eating the right foods and says she is a little confused about what she is supposed to be eating. Also wanted to make sure that her last set of labs looked good. Says that for breakfast: Sometimes she has a boiled egg and a piece of toast with butter. Sometimes she has oatmeal. Sometimes she has cereal with banana. For lunch: Usually has a sandwich--either a [place] sandwich or chicken salad sandwich or pimento cheese sandwich For dinner: Says it just depends--sometimes may have something like pinto beans etc.  Today I gave and reviewed carbohydrate handout and explained to her. Also gave her some examples of good food choices. Discussed that what she is eating for  breakfast sounds pretty good. Lunch sounds okay except to be careful of the bread intake with the sandwiches. Discussed eating protein with each meal and then eating vegetables.  Reminded her of the carbohydrate handout as far as limiting these carbohydrates.  Discussed that her A1c at last lab was ok---and has been stable for the past couple years.  She says she has decreased her portions considerably. Says in past was eating way too much and she has lost weight since decreasing portions and amount she is eating.  Today she also says that both of her knees ache but that the tramadol I give her does control the pain. She then pulls up  her pant legs and shows me her knees.     Home Meds:   Outpatient Prescriptions Prior to Visit  Medication Sig Dispense Refill  . Calcium Carbonate-Vitamin D (CALCIUM 600+D) 600-400 MG-UNIT per tablet Take 1 tablet by mouth 2 (two) times daily.    . hydrochlorothiazide (HYDRODIURIL) 25 MG tablet TAKE 1 TABLET BY MOUTH EVERY DAY 90 tablet 1  . lisinopril (PRINIVIL,ZESTRIL) 20 MG tablet Take 1 tablet (20 mg total) by mouth daily. 90 tablet 3  . traMADol (ULTRAM) 50 MG tablet TAKE 1 TO 2 TABLETS EVERY 8 HOURS AS NEEDED FOR PAIN 90 tablet 1  . acetaminophen (TYLENOL) 650 MG CR tablet Take 1,300 mg by mouth 2 (two) times daily as needed for pain (for arthritis pain).     No facility-administered medications prior to visit.    Allergies: No Known Allergies    Review of Systems: See HPI for pertinent ROS. All other ROS negative.    Physical Exam: Blood pressure 112/60, pulse 80, temperature 98.1 F (36.7 C), temperature source Oral, resp. rate 18, weight 172 lb (78.019 kg)., Body mass index is 31.21 kg/(m^2). General:  WNWD WF. Appears in no acute distress. Neck: Supple. No thyromegaly. No lymphadenopathy. Lungs: Clear bilaterally to auscultation without wheezes, rales, or rhonchi. Breathing is unlabored. Heart: Regular rhythm. No murmurs, rubs, or gallops. Abdomen: Soft, non-tender, non-distended with normoactive bowel sounds. No hepatomegaly. No rebound/guarding. No obvious abdominal masses.  Msk:  Strength and tone normal for age. Bilateral Knees: Inferomedial aspect of bilateral knees protrudes. Palpation of this area bilaterally: These areas are firm.  Not soft like effusions.  Extremities/Skin: Warm and dry. No clubbing or cyanosis. No edema. No rashes or suspicious lesions. Neuro: Alert and oriented X 3. Moves all extremities spontaneously. Gait is normal. CNII-XII grossly in tact. Psych:  Responds to questions appropriately with a normal affect.     ASSESSMENT AND PLAN:  79  y.o. year old female with  1. Hyperglycemia Reassured her that this recent A1c was stable and has been stable for the past couple years. Sheis to continue the low carbohydrate diet to keep this controlled and we will continue to monitor this at follow-up.  2. Arthralgia of both knees I discussed with her that my prior notes document that she had seen a specialist in the past and that she had been told that she had osteoarthritis of her knees. She says that this is true but that it has been quite some time since she saw that specialist. I will go ahead and obtain x-rays of both knees and will follow up with her when I get these results. In the interim she says that her pain is controlled with current medications including tramadol. - DG Knee Complete 4 Views Left; Future - DG Knee Complete 4 Views Right; Future   Signed, Olean Ree [place], [place], [facility] 07/04/2015 10:11 AM

## 2015-07-07 ENCOUNTER — Telehealth: Payer: Self-pay | Admitting: Family Medicine

## 2015-07-07 NOTE — Telephone Encounter (Signed)
Pt C/O knee is really hurting and the Tramadol is making her sick.  Very swimmy headed and can't think.  Told to stop Tramadol and only use the Arthritis strength Tylenol.  Asked referral nurse to expedite ortho appt (pt has asked for appt after the holidays).  Gave her appt for 12/16 with Dr Dennard Schaumann for possible injection for temp relief while waiting for ortho appt.

## 2015-07-08 ENCOUNTER — Ambulatory Visit (INDEPENDENT_AMBULATORY_CARE_PROVIDER_SITE_OTHER): Payer: Commercial Managed Care - HMO | Admitting: Family Medicine

## 2015-07-08 ENCOUNTER — Encounter: Payer: Self-pay | Admitting: Family Medicine

## 2015-07-08 ENCOUNTER — Ambulatory Visit: Payer: Self-pay | Admitting: Family Medicine

## 2015-07-08 VITALS — BP 120/62 | HR 90 | Temp 98.5°F | Resp 18 | Ht 63.0 in | Wt 170.0 lb

## 2015-07-08 DIAGNOSIS — M25562 Pain in left knee: Secondary | ICD-10-CM

## 2015-07-08 DIAGNOSIS — M25561 Pain in right knee: Secondary | ICD-10-CM
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
## 2015-07-09 ENCOUNTER — Encounter (HOSPITAL_COMMUNITY): Payer: Self-pay | Admitting: Emergency Medicine

## 2015-07-09 ENCOUNTER — Emergency Department (HOSPITAL_COMMUNITY): Payer: Commercial Managed Care - HMO

## 2015-07-09 ENCOUNTER — Emergency Department (HOSPITAL_COMMUNITY)
Admission: EM | Admit: 2015-07-09 | Discharge: 2015-07-09 | Disposition: A | Discharge status: Home / Self Care | Payer: Commercial Managed Care - HMO | Attending: Emergency Medicine | Admitting: Emergency Medicine

## 2015-07-09 DIAGNOSIS — R1032 Left lower quadrant pain: Secondary | ICD-10-CM | POA: Diagnosis not present

## 2015-07-09 DIAGNOSIS — Z79899 Other long term (current) drug therapy: Secondary | ICD-10-CM | POA: Insufficient documentation

## 2015-07-09 DIAGNOSIS — M1712 Unilateral primary osteoarthritis, left knee: Secondary | ICD-10-CM | POA: Insufficient documentation

## 2015-07-09 DIAGNOSIS — I10 Essential (primary) hypertension: Secondary | ICD-10-CM | POA: Insufficient documentation

## 2015-07-09 DIAGNOSIS — M1711 Unilateral primary osteoarthritis, right knee: Secondary | ICD-10-CM | POA: Diagnosis not present

## 2015-07-09 DIAGNOSIS — K439 Ventral hernia without obstruction or gangrene: Secondary | ICD-10-CM | POA: Diagnosis not present

## 2015-07-09 DIAGNOSIS — R1030 Lower abdominal pain, unspecified: Secondary | ICD-10-CM | POA: Diagnosis not present

## 2015-07-09 DIAGNOSIS — R112 Nausea with vomiting, unspecified: Secondary | ICD-10-CM

## 2015-07-09 DIAGNOSIS — R1031 Right lower quadrant pain: Secondary | ICD-10-CM | POA: Insufficient documentation

## 2015-07-09 DIAGNOSIS — R1010 Upper abdominal pain, unspecified: Secondary | ICD-10-CM | POA: Diagnosis not present

## 2015-07-09 LAB — COMPREHENSIVE METABOLIC PANEL
ALBUMIN: 3.7 g/dL (ref 3.5–5.0)
ALK PHOS: 65 U/L (ref 38–126)
ALT: 22 U/L (ref 14–54)
ANION GAP: 10 (ref 5–15)
AST: 19 U/L (ref 15–41)
BUN: 20 mg/dL (ref 6–20)
CALCIUM: 9.6 mg/dL (ref 8.9–10.3)
CO2: 23 mmol/L (ref 22–32)
Chloride: 101 mmol/L (ref 101–111)
Creatinine, Ser: 0.83 mg/dL (ref 0.44–1.00)
GFR calc Af Amer: >60 mL/min (ref >60)
GFR calc non Af Amer: >60 mL/min (ref >60)
GLUCOSE: 145 mg/dL — AB (ref 65–99)
Potassium: 3.9 mmol/L (ref 3.5–5.1)
SODIUM: 134 mmol/L — AB (ref 135–145)
Total Bilirubin: 0.8 mg/dL (ref 0.3–1.2)
Total Protein: 7 g/dL (ref 6.5–8.1)

## 2015-07-09 LAB — URINALYSIS, ROUTINE W REFLEX MICROSCOPIC
Bilirubin Urine: NEGATIVE
Glucose, UA: 100 mg/dL — AB
Hgb urine dipstick: NEGATIVE
KETONES UR: 15 mg/dL — AB
NITRITE: NEGATIVE
PROTEIN: NEGATIVE mg/dL
Specific Gravity, Urine: 1.022 (ref 1.005–1.030)
pH: 8 (ref 5.0–8.0)

## 2015-07-09 LAB — URINE MICROSCOPIC-ADD ON

## 2015-07-09 LAB — CBC
HEMATOCRIT: 44.7 % (ref 36.0–46.0)
HEMOGLOBIN: 15.1 g/dL — AB (ref 12.0–15.0)
MCH: 30.9 pg (ref 26.0–34.0)
MCHC: 33.8 g/dL (ref 30.0–36.0)
MCV: 91.6 fL (ref 78.0–100.0)
Platelets: 338 10*3/uL (ref 150–400)
RBC: 4.88 MIL/uL (ref 3.87–5.11)
RDW: 13.3 % (ref 11.5–15.5)
WBC: 14.1 10*3/uL — ABNORMAL HIGH (ref 4.0–10.5)

## 2015-07-09 LAB — I-STAT CG4 LACTIC ACID, ED
LACTIC ACID, VENOUS: 2.15 mmol/L — AB (ref 0.5–2.0)
LACTIC ACID, VENOUS: 1.32 mmol/L (ref 0.5–2.0)

## 2015-07-09 LAB — LIPASE, BLOOD: Lipase: 33 U/L (ref 11–51)

## 2015-07-09 MED ORDER — IOHEXOL 300 MG/ML  SOLN
100.0000 mL | Freq: Once | INTRAMUSCULAR | Status: AC | PRN
  Administered 2015-07-09: 100 mL via INTRAVENOUS

## 2015-07-09 MED ORDER — IOHEXOL 300 MG/ML  SOLN
25.0000 mL | Freq: Once | INTRAMUSCULAR | Status: AC | PRN
  Administered 2015-07-09: 25 mL via ORAL

## 2015-07-09 MED ORDER — SODIUM CHLORIDE 0.9 % IV BOLUS (SEPSIS)
500.0000 mL | Freq: Once | INTRAVENOUS | Status: AC
  Administered 2015-07-09: 500 mL via INTRAVENOUS

## 2015-07-09 MED ORDER — ONDANSETRON HCL 4 MG/2ML IJ SOLN
4.0000 mg | Freq: Once | INTRAMUSCULAR | Status: AC
  Administered 2015-07-09: 4 mg via INTRAVENOUS
  Filled 2015-07-09: qty 2

## 2015-07-09 MED ORDER — PROMETHAZINE HCL 25 MG/ML IJ SOLN
12.5000 mg | Freq: Once | INTRAMUSCULAR | Status: AC
  Administered 2015-07-09: 12.5 mg via INTRAVENOUS
  Filled 2015-07-09: qty 1

## 2015-07-09 MED ORDER — ONDANSETRON HCL 4 MG PO TABS: 4.0000 mg | ORAL_TABLET | Freq: Four times a day (QID) | ORAL | Status: DC

## 2015-07-09 MED ORDER — DIPHENOXYLATE-ATROPINE 2.5-0.025 MG PO TABS: 1.0000 | ORAL_TABLET | Freq: Four times a day (QID) | ORAL | Status: DC | PRN

## 2015-07-09 MED ORDER — SODIUM CHLORIDE 0.9 % IV BOLUS (SEPSIS): 1000.0000 mL | Freq: Once | INTRAVENOUS | Status: DC
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
## 2015-07-09 NOTE — ED Provider Notes (Deleted)
Pt seen and examined.  D/W Kayla rose PA-C.  Pt describes 3 days of worsening generalized weakness.  Initially Le, but now global and noticing LUE weakness.  Is a [facility] patient.  Cycles 4 times/24 hours.  Is somewhat evasive, but states that "sometimes I get off".  States he has not missed any for the last 48 hours.  States that his MD called him Friday with a "Potasssium level".  He cannot rememember what it was. He states that he was NOT instructed to go to ER at that time.  Claims not able to walk today b/c of the weakness, and presents here.  On esam, pt with 3-4/5 strength to BLE.  2-3+edema noted as well.  1+ reflexes to patellar, 0 to Achilles (marked edema noted as well).  Will check K+.  No respiratory compromise.   Tanna Furry, MD 07/09/15 [phone]

## 2015-07-09 NOTE — ED Provider Notes (Signed)
Pt c/o Epigastric AP, with Nausea for 3-4 days.  Stopped Tramadol 5 days ago at request of PCP.  Admits to cramps, sweats, and abd symptoms.  Abdomen benign.  Plan IVF, labs, emetic control. CT A/P.  Tanna Furry, MD 07/09/15 [phone]

## 2015-07-09 NOTE — ED Notes (Signed)
Pt sts lower abd pain and N/V

## 2015-07-09 NOTE — ED Notes (Signed)
Pt comfortable with discharge and follow up instructions. Prescriptions x2. 

## 2015-07-09 NOTE — Discharge Instructions (Signed)

## 2015-07-10 ENCOUNTER — Encounter (HOSPITAL_COMMUNITY): Payer: Self-pay | Admitting: Emergency Medicine

## 2015-07-10 ENCOUNTER — Emergency Department (HOSPITAL_COMMUNITY)
Admission: EM | Admit: 2015-07-10 | Discharge: 2015-07-10 | Disposition: A | Discharge status: Home / Self Care | Payer: Commercial Managed Care - HMO | Attending: Emergency Medicine | Admitting: Emergency Medicine

## 2015-07-10 DIAGNOSIS — Z79899 Other long term (current) drug therapy: Secondary | ICD-10-CM | POA: Diagnosis not present

## 2015-07-10 DIAGNOSIS — M170 Bilateral primary osteoarthritis of knee: Secondary | ICD-10-CM | POA: Insufficient documentation

## 2015-07-10 DIAGNOSIS — Z87828 Personal history of other (healed) physical injury and trauma: Secondary | ICD-10-CM | POA: Insufficient documentation

## 2015-07-10 DIAGNOSIS — M8580 Other specified disorders of bone density and structure, unspecified site: Secondary | ICD-10-CM | POA: Diagnosis not present

## 2015-07-10 DIAGNOSIS — R110 Nausea: Secondary | ICD-10-CM

## 2015-07-10 DIAGNOSIS — R112 Nausea with vomiting, unspecified: Secondary | ICD-10-CM | POA: Diagnosis not present

## 2015-07-10 DIAGNOSIS — I10 Essential (primary) hypertension: Secondary | ICD-10-CM | POA: Insufficient documentation

## 2015-07-10 MED ORDER — ONDANSETRON 4 MG PO TBDP
4.0000 mg | ORAL_TABLET | Freq: Once | ORAL | Status: AC | PRN
  Administered 2015-07-10: 4 mg via ORAL
  Filled 2015-07-10: qty 1

## 2015-07-10 NOTE — Discharge Instructions (Signed)
Continue to follow your discharge instructions from yesterday. Fluids at home. Zofran for nausea. Stop tramadol

## 2015-07-10 NOTE — ED Notes (Signed)
Patient given water per MD request and tolerated well, states would like more.

## 2015-07-10 NOTE — ED Notes (Signed)
Pt c/o continuing to vomit. Pt was seen here yesterday but was unable to fill her prescription.

## 2015-08-03 ENCOUNTER — Emergency Department (HOSPITAL_COMMUNITY)
Admission: EM | Admit: 2015-08-03 | Discharge: 2015-08-04 | Disposition: A | Discharge status: Home / Self Care | Payer: Commercial Managed Care - HMO | Attending: Emergency Medicine | Admitting: Emergency Medicine

## 2015-08-03 ENCOUNTER — Encounter (HOSPITAL_COMMUNITY): Payer: Self-pay | Admitting: Emergency Medicine

## 2015-08-03 DIAGNOSIS — R627 Adult failure to thrive: Secondary | ICD-10-CM | POA: Diagnosis not present

## 2015-08-03 DIAGNOSIS — Z87828 Personal history of other (healed) physical injury and trauma: Secondary | ICD-10-CM | POA: Diagnosis not present

## 2015-08-03 DIAGNOSIS — R110 Nausea: Secondary | ICD-10-CM | POA: Insufficient documentation

## 2015-08-03 DIAGNOSIS — Z79899 Other long term (current) drug therapy: Secondary | ICD-10-CM | POA: Insufficient documentation

## 2015-08-03 DIAGNOSIS — M1990 Unspecified osteoarthritis, unspecified site: Secondary | ICD-10-CM | POA: Insufficient documentation

## 2015-08-03 DIAGNOSIS — R634 Abnormal weight loss: Secondary | ICD-10-CM | POA: Diagnosis not present

## 2015-08-03 DIAGNOSIS — R069 Unspecified abnormalities of breathing: Secondary | ICD-10-CM | POA: Diagnosis not present

## 2015-08-03 DIAGNOSIS — F419 Anxiety disorder, unspecified: Secondary | ICD-10-CM | POA: Diagnosis not present

## 2015-08-03 DIAGNOSIS — R197 Diarrhea, unspecified: Secondary | ICD-10-CM | POA: Insufficient documentation

## 2015-08-03 DIAGNOSIS — I10 Essential (primary) hypertension: Secondary | ICD-10-CM | POA: Diagnosis not present

## 2015-08-03 DIAGNOSIS — R0602 Shortness of breath: Secondary | ICD-10-CM | POA: Diagnosis present

## 2015-08-03 DIAGNOSIS — R630 Anorexia: Secondary | ICD-10-CM | POA: Insufficient documentation

## 2015-08-03 NOTE — ED Notes (Signed)
Per [facility] pt called EMS due to breathing difficulties. Pt shaking, hyper ventaling, high anxiety. BP low. "lost 100 lbs recently." Diarrhea ever time she eats. Alert and oriented x3. CBG 105. 100/50 BP 18LAC. Lives at home. Symptoms have been ongoing for a couple of weeks. Lungs clear.
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
## 2015-08-03 NOTE — ED Notes (Signed)
Pt states she threw up once does not know when. Pt unable to tell this RN how tall she is or how much she weighs. Pt states she has lost wt but does not know how much

## 2015-08-04 ENCOUNTER — Ambulatory Visit (INDEPENDENT_AMBULATORY_CARE_PROVIDER_SITE_OTHER): Payer: Commercial Managed Care - HMO | Admitting: Physician Assistant

## 2015-08-04 ENCOUNTER — Emergency Department (HOSPITAL_COMMUNITY): Payer: Commercial Managed Care - HMO

## 2015-08-04 ENCOUNTER — Encounter: Payer: Self-pay | Admitting: Physician Assistant

## 2015-08-04 VITALS — BP 104/60 | HR 80 | Temp 98.0°F | Resp 18 | Wt 154.0 lb

## 2015-08-04 DIAGNOSIS — R630 Anorexia: Secondary | ICD-10-CM | POA: Diagnosis not present

## 2015-08-04 DIAGNOSIS — R634 Abnormal weight loss: Secondary | ICD-10-CM | POA: Diagnosis not present

## 2015-08-04 DIAGNOSIS — R0602 Shortness of breath: Secondary | ICD-10-CM | POA: Diagnosis not present

## 2015-08-04 DIAGNOSIS — R197 Diarrhea, unspecified: Secondary | ICD-10-CM

## 2015-08-04 LAB — URINALYSIS, ROUTINE W REFLEX MICROSCOPIC
BILIRUBIN URINE: NEGATIVE
Glucose, UA: NEGATIVE mg/dL
Hgb urine dipstick: NEGATIVE
KETONES UR: 15 mg/dL — AB
NITRITE: NEGATIVE
PH: 6.5 (ref 5.0–8.0)
Protein, ur: NEGATIVE mg/dL
SPECIFIC GRAVITY, URINE: 1.006 (ref 1.005–1.030)

## 2015-08-04 LAB — CBC WITH DIFFERENTIAL/PLATELET
BASOS ABS: 0 10*3/uL (ref 0.0–0.1)
BASOS PCT: 0 %
EOS ABS: 0.1 10*3/uL (ref 0.0–0.7)
EOS PCT: 1 %
HCT: 42.5 % (ref 36.0–46.0)
Hemoglobin: 14.6 g/dL (ref 12.0–15.0)
LYMPHS ABS: 1.9 10*3/uL (ref 0.7–4.0)
Lymphocytes Relative: 25 %
MCH: 31.3 pg (ref 26.0–34.0)
MCHC: 34.4 g/dL (ref 30.0–36.0)
MCV: 91.2 fL (ref 78.0–100.0)
Monocytes Absolute: 0.7 10*3/uL (ref 0.1–1.0)
Monocytes Relative: 9 %
NEUTROS ABS: 5 10*3/uL (ref 1.7–7.7)
NEUTROS PCT: 65 %
PLATELETS: 244 10*3/uL (ref 150–400)
RBC: 4.66 MIL/uL (ref 3.87–5.11)
RDW: 13.2 % (ref 11.5–15.5)
WBC: 7.7 10*3/uL (ref 4.0–10.5)

## 2015-08-04 LAB — D-DIMER, QUANTITATIVE (NOT AT ARMC): D DIMER QUANT: 0.33 ug{FEU}/mL (ref 0.00–0.50)

## 2015-08-04 LAB — COMPREHENSIVE METABOLIC PANEL
ALT: 58 U/L — AB (ref 14–54)
AST: 34 U/L (ref 15–41)
Albumin: 3.4 g/dL — ABNORMAL LOW (ref 3.5–5.0)
Alkaline Phosphatase: 54 U/L (ref 38–126)
Anion gap: 11 (ref 5–15)
BUN: 19 mg/dL (ref 6–20)
CHLORIDE: 98 mmol/L — AB (ref 101–111)
CO2: 24 mmol/L (ref 22–32)
CREATININE: 1.05 mg/dL — AB (ref 0.44–1.00)
Calcium: 8.9 mg/dL (ref 8.9–10.3)
GFR calc Af Amer: 58 mL/min — ABNORMAL LOW (ref >60)
GFR calc non Af Amer: 50 mL/min — ABNORMAL LOW (ref >60)
Glucose, Bld: 106 mg/dL — ABNORMAL HIGH (ref 65–99)
POTASSIUM: 3.6 mmol/L (ref 3.5–5.1)
SODIUM: 133 mmol/L — AB (ref 135–145)
Total Bilirubin: 1.2 mg/dL (ref 0.3–1.2)
Total Protein: 5.7 g/dL — ABNORMAL LOW (ref 6.5–8.1)

## 2015-08-04 LAB — I-STAT CG4 LACTIC ACID, ED
LACTIC ACID, VENOUS: 1.19 mmol/L (ref 0.5–2.0)
Lactic Acid, Venous: 0.54 mmol/L (ref 0.5–2.0)

## 2015-08-04 LAB — LIPASE, BLOOD: Lipase: 39 U/L (ref 11–51)

## 2015-08-04 LAB — URINE MICROSCOPIC-ADD ON

## 2015-08-04 LAB — TROPONIN I: Troponin I: <0.03 ng/mL (ref <0.031)

## 2015-08-04 LAB — BRAIN NATRIURETIC PEPTIDE: B NATRIURETIC PEPTIDE 5: 43.9 pg/mL (ref 0.0–100.0)

## 2015-08-04 NOTE — Discharge Instructions (Signed)
Failure to Thrive, Adult Failure to thrive is a group of symptoms that affect elderly adults. These symptoms include loss of appetite and weight loss. People who have this condition may do fewer and fewer activities over time. They may lose interest in being with friends or they may not want to eat or drink. This condition is not a normal part of aging. CAUSES This condition may be caused by:  A disease, such dementia, diabetes, cancer, or lung disease.  A health problem, such as a vitamin deficiency or a heart problem.  A disorder, such as depression.  A disability.  Medicines.  Mistreatment or neglect. In some cases, the cause may not be known. SYMPTOMS Symptoms of this condition include:  Loss of more than 5% of your body weight.  Being more tired than normal after an activity.  Having trouble getting up after sitting.  Loss of appetite.  Not getting out of bed.  Not wanting to do usual activities.  Depression.  Getting infections often.  Bedsores.  Taking a long time to recover after an injury or a surgery.  Weakness. DIAGNOSIS This condition may be diagnosed with a physical exam. Your health care provider will ask questions about your health, behavior, and mood, such as:  Has your activity changed?  Do you seem sad?  Are your eating habits different? Tests may also be done. They may include:  Blood tests.  Urine tests.  Imaging tests, such as X-rays, a CT scan, or MRI.  Hearing tests.  Vision tests.  Tests to check thinking ability (cognitive tests).  Activity tests to see if you can do tasks such as bathing and dressing and to see if you can move around safely. You may be referred to a specialist. TREATMENT Treatment for this condition depends on the cause. It may involve:  Treating the cause.  Talk therapy or medicine to treat depression.  Improving diet, such as by eating more often or taking nutritional supplements.  Changing or  stopping a medicine.  Physical therapy. It often takes a team of health care providers to find the right treatment. HOME CARE INSTRUCTIONS  Take over-the-counter and prescription medicines only as told by your health care provider.  Eat a healthy, well-balanced diet. Make sure to get enough calories in each meal.  Be physically active. Include strength training as part of your exercise routine. A physical therapist can help to set up an exercise program that fits you.  Make sure that you are safe at home.  Make sure that you have a plan for what to do if you become unable to make decisions for yourself. SEEK MEDICAL CARE IF:  You are not able to eat well.  You are not able to move around.  You feel very sad or hopeless. SEEK IMMEDIATE MEDICAL CARE IF:  You have thoughts of ending your life.  You cannot eat or drink.  You do not get out of bed.  Staying at home is no longer safe.  You have a fever.   This information is not intended to replace advice given to you by your health care provider. Make sure you discuss any questions you have with your health care provider.   Document Released: 10/01/2011 Document Revised: 03/30/2015 Document Reviewed: 10/04/2014 Elsevier Interactive Patient Education 2016 [employer]  

## 2015-08-04 NOTE — ED Notes (Signed)
Pt was ambulated with a starting POX of 100% and remained between 95-100% throughout the walk.

## 2015-08-05 ENCOUNTER — Telehealth: Payer: Self-pay | Admitting: *Deleted

## 2015-08-05 ENCOUNTER — Telehealth: Payer: Self-pay | Admitting: Physician Assistant

## 2015-08-05 DIAGNOSIS — R197 Diarrhea, unspecified: Secondary | ICD-10-CM

## 2015-08-05 DIAGNOSIS — R630 Anorexia: Secondary | ICD-10-CM

## 2015-08-05 DIAGNOSIS — K529 Noninfective gastroenteritis and colitis, unspecified: Secondary | ICD-10-CM

## 2015-08-05 DIAGNOSIS — R634 Abnormal weight loss: Secondary | ICD-10-CM

## 2015-08-05 LAB — CBC WITH DIFFERENTIAL/PLATELET
BASOS PCT: 0 % (ref 0–1)
Basophils Absolute: 0 10*3/uL (ref 0.0–0.1)
EOS ABS: 0.1 10*3/uL (ref 0.0–0.7)
EOS PCT: 1 % (ref 0–5)
HCT: 42.9 % (ref 36.0–46.0)
Hemoglobin: 14.8 g/dL (ref 12.0–15.0)
LYMPHS ABS: 2.6 10*3/uL (ref 0.7–4.0)
Lymphocytes Relative: 27 % (ref 12–46)
MCH: 31.8 pg (ref 26.0–34.0)
MCHC: 34.5 g/dL (ref 30.0–36.0)
MCV: 92.1 fL (ref 78.0–100.0)
MONOS PCT: 7 % (ref 3–12)
MPV: 8.4 fL — ABNORMAL LOW (ref 8.6–12.4)
Monocytes Absolute: 0.7 10*3/uL (ref 0.1–1.0)
Neutro Abs: 6.2 10*3/uL (ref 1.7–7.7)
Neutrophils Relative %: 65 % (ref 43–77)
PLATELETS: 291 10*3/uL (ref 150–400)
RBC: 4.66 MIL/uL (ref 3.87–5.11)
RDW: 14 % (ref 11.5–15.5)
WBC: 9.5 10*3/uL (ref 4.0–10.5)

## 2015-08-05 LAB — COMPLETE METABOLIC PANEL WITH GFR
ALT: 54 U/L — ABNORMAL HIGH (ref 6–29)
AST: 30 U/L (ref 10–35)
Albumin: 3.8 g/dL (ref 3.6–5.1)
Alkaline Phosphatase: 53 U/L (ref 33–130)
BILIRUBIN TOTAL: 0.7 mg/dL (ref 0.2–1.2)
BUN: 18 mg/dL (ref 7–25)
CHLORIDE: 96 mmol/L — AB (ref 98–110)
CO2: 26 mmol/L (ref 20–31)
Calcium: 8.6 mg/dL (ref 8.6–10.4)
Creat: 0.89 mg/dL (ref 0.60–0.93)
GFR, EST AFRICAN AMERICAN: 72 mL/min (ref >=60)
GFR, EST NON AFRICAN AMERICAN: 63 mL/min (ref >=60)
Glucose, Bld: 86 mg/dL (ref 70–99)
POTASSIUM: 4 mmol/L (ref 3.5–5.3)
Sodium: 135 mmol/L (ref 135–146)
Total Protein: 5.7 g/dL — ABNORMAL LOW (ref 6.1–8.1)

## 2015-08-05 LAB — CELIAC PANEL 10
ENDOMYSIAL SCREEN: NEGATIVE
GLIADIN IGA: 3 U (ref <20)
GLIADIN IGG: 2 U (ref <20)
IGA: 164 mg/dL (ref 69–380)
TISSUE TRANSGLUTAMINASE AB, IGA: 1 U/mL (ref <4)
Tissue Transglut Ab: 1 U/mL (ref <6)

## 2015-08-05 LAB — TSH: TSH: 3.431 u[IU]/mL (ref 0.350–4.500)

## 2015-08-05 NOTE — Telephone Encounter (Signed)
Kelsey Hebert,  Also, DEFINITELY needs to see GI and NOT nutritionist--- 40 POUND UNINTENTIONAL WEIGHT LOSS 12/18/2012--194 pounds Now---154 pounds  Also, CT report 07/09/15 includes: " Questionable rectosigmoid wall thickening without inflammatory changes, potentionally related to prior episodes of diverticulitis but mass is not excluded. Follow-up non-emergent endoscopic assessment recommended to definitively exclude tumor."  She MUST f/u with GI.

## 2015-08-05 NOTE — Telephone Encounter (Signed)
Kim,  Please call them back and tell them she REALLY needs to go to GI doctor.  Tell them there is an underlying medical problem causing this---she would not have this weight loss and other symptoms otherwise.
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
## 2015-08-05 NOTE — Addendum Note (Signed)
Addended by: Dena Billet on: 08/05/2015 08:08 PM   Modules accepted: Orders, Medications

## 2015-08-05 NOTE — Telephone Encounter (Signed)
Pt husband called and stated they did not want to go to Gastroenterology and had declined the referral but instead wants to go to a nutritionist, I placed referral to Nutritionist.

## 2015-08-08 ENCOUNTER — Encounter: Payer: Self-pay | Admitting: Internal Medicine

## 2015-08-08 NOTE — Telephone Encounter (Signed)
Spoke to patient and husband.  Told MUST see GI doctor. Really feels with current condition does not feel referral appropriate until issue resolves.  Soft diet is not what is causing her chronic diarrhea and she did have abnormal CT in December which she can not ignore.  Fine to see Nutritionist but needs to see GI provider.  They are the ones who are going to find out reason for weight loss and diarrhea.  Both agree and are in agreement to follow up with GI referral.  New referral initiated

## 2015-08-08 NOTE — Addendum Note (Signed)
Addended by: Olena Mater on: 08/08/2015 09:09 AM   Modules accepted: Orders

## 2015-08-09 ENCOUNTER — Telehealth: Payer: Self-pay | Admitting: *Deleted

## 2015-08-09 NOTE — Telephone Encounter (Signed)
Submitted [employer] referral thru [facility] for authorization to Dr. Tonna Corner [name],MD with authorization [id number]  Requesting provider: Flonnie Hailstone  Treating provider: Tonna Corner [name],MD  Number of visits:6  Start Date: 08/18/15  End Date:02/14/16  Dx: M17.0- Bilateral primary osteoarthritis of knee

## 2015-08-10 ENCOUNTER — Other Ambulatory Visit: Payer: Self-pay | Admitting: Physician Assistant

## 2015-08-10 NOTE — Telephone Encounter (Signed)
HCTZ has been discontinued and refill denied
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
## 2015-09-05 ENCOUNTER — Ambulatory Visit: Payer: Commercial Managed Care - HMO | Admitting: Physician Assistant

## 2015-09-05 ENCOUNTER — Emergency Department (HOSPITAL_COMMUNITY)
Admission: EM | Admit: 2015-09-05 | Discharge: 2015-09-05 | Discharge status: Absent without leave | Payer: Commercial Managed Care - HMO | Source: Home / Self Care

## 2015-09-07 ENCOUNTER — Ambulatory Visit: Payer: Commercial Managed Care - HMO | Admitting: Physician Assistant

## 2015-09-09 ENCOUNTER — Ambulatory Visit: Payer: Commercial Managed Care - HMO | Admitting: Internal Medicine

## 2015-10-07 DIAGNOSIS — S72001A Fracture of unspecified part of neck of right femur, initial encounter for closed fracture: Secondary | ICD-10-CM | POA: Diagnosis not present

## 2015-10-08 ENCOUNTER — Emergency Department (HOSPITAL_COMMUNITY): Payer: Commercial Managed Care - HMO

## 2015-10-08 ENCOUNTER — Encounter (HOSPITAL_COMMUNITY): Payer: Self-pay | Admitting: *Deleted

## 2015-10-08 ENCOUNTER — Inpatient Hospital Stay (HOSPITAL_COMMUNITY)
Admission: EM | Admit: 2015-10-08 | Discharge: 2015-10-11 | DRG: 470 | Disposition: A | Discharge status: Skilled Nursing Facility | Payer: Commercial Managed Care - HMO | Attending: Internal Medicine | Admitting: Internal Medicine

## 2015-10-08 DIAGNOSIS — S72009A Fracture of unspecified part of neck of unspecified femur, initial encounter for closed fracture: Secondary | ICD-10-CM

## 2015-10-08 DIAGNOSIS — W1830XA Fall on same level, unspecified, initial encounter: Secondary | ICD-10-CM | POA: Diagnosis present

## 2015-10-08 DIAGNOSIS — R278 Other lack of coordination: Secondary | ICD-10-CM | POA: Diagnosis not present

## 2015-10-08 DIAGNOSIS — S199XXA Unspecified injury of neck, initial encounter: Secondary | ICD-10-CM | POA: Diagnosis not present

## 2015-10-08 DIAGNOSIS — H109 Unspecified conjunctivitis: Secondary | ICD-10-CM | POA: Diagnosis present

## 2015-10-08 DIAGNOSIS — Z6823 Body mass index (BMI) 23.0-23.9, adult: Secondary | ICD-10-CM

## 2015-10-08 DIAGNOSIS — Y92009 Unspecified place in unspecified non-institutional (private) residence as the place of occurrence of the external cause: Secondary | ICD-10-CM | POA: Diagnosis not present

## 2015-10-08 DIAGNOSIS — D62 Acute posthemorrhagic anemia: Secondary | ICD-10-CM | POA: Diagnosis not present

## 2015-10-08 DIAGNOSIS — S72011A Unspecified intracapsular fracture of right femur, initial encounter for closed fracture: Principal | ICD-10-CM | POA: Diagnosis present

## 2015-10-08 DIAGNOSIS — S72001A Fracture of unspecified part of neck of right femur, initial encounter for closed fracture: Secondary | ICD-10-CM | POA: Diagnosis present

## 2015-10-08 DIAGNOSIS — Z79899 Other long term (current) drug therapy: Secondary | ICD-10-CM

## 2015-10-08 DIAGNOSIS — E876 Hypokalemia: Secondary | ICD-10-CM | POA: Diagnosis present

## 2015-10-08 DIAGNOSIS — M6281 Muscle weakness (generalized): Secondary | ICD-10-CM | POA: Diagnosis not present

## 2015-10-08 DIAGNOSIS — R262 Difficulty in walking, not elsewhere classified: Secondary | ICD-10-CM | POA: Diagnosis not present

## 2015-10-08 DIAGNOSIS — E440 Moderate protein-calorie malnutrition: Secondary | ICD-10-CM | POA: Diagnosis present

## 2015-10-08 DIAGNOSIS — W19XXXD Unspecified fall, subsequent encounter: Secondary | ICD-10-CM | POA: Diagnosis not present

## 2015-10-08 DIAGNOSIS — M8580 Other specified disorders of bone density and structure, unspecified site: Secondary | ICD-10-CM | POA: Diagnosis present

## 2015-10-08 DIAGNOSIS — T148 Other injury of unspecified body region: Secondary | ICD-10-CM | POA: Diagnosis not present

## 2015-10-08 DIAGNOSIS — M1990 Unspecified osteoarthritis, unspecified site: Secondary | ICD-10-CM | POA: Diagnosis present

## 2015-10-08 DIAGNOSIS — W19XXXA Unspecified fall, initial encounter: Secondary | ICD-10-CM | POA: Diagnosis not present

## 2015-10-08 DIAGNOSIS — Z471 Aftercare following joint replacement surgery: Secondary | ICD-10-CM | POA: Diagnosis not present

## 2015-10-08 DIAGNOSIS — D649 Anemia, unspecified: Secondary | ICD-10-CM | POA: Diagnosis present

## 2015-10-08 DIAGNOSIS — E43 Unspecified severe protein-calorie malnutrition: Secondary | ICD-10-CM | POA: Diagnosis present

## 2015-10-08 DIAGNOSIS — S72044A Nondisplaced fracture of base of neck of right femur, initial encounter for closed fracture: Secondary | ICD-10-CM | POA: Diagnosis not present

## 2015-10-08 DIAGNOSIS — M25551 Pain in right hip: Secondary | ICD-10-CM | POA: Diagnosis not present

## 2015-10-08 DIAGNOSIS — R51 Headache: Secondary | ICD-10-CM | POA: Diagnosis not present

## 2015-10-08 DIAGNOSIS — I10 Essential (primary) hypertension: Secondary | ICD-10-CM | POA: Diagnosis not present

## 2015-10-08 DIAGNOSIS — Z9181 History of falling: Secondary | ICD-10-CM | POA: Diagnosis not present

## 2015-10-08 DIAGNOSIS — Z419 Encounter for procedure for purposes other than remedying health state, unspecified: Secondary | ICD-10-CM

## 2015-10-08 DIAGNOSIS — R1312 Dysphagia, oropharyngeal phase: Secondary | ICD-10-CM | POA: Diagnosis not present

## 2015-10-08 DIAGNOSIS — S0990XA Unspecified injury of head, initial encounter: Secondary | ICD-10-CM | POA: Diagnosis not present

## 2015-10-08 DIAGNOSIS — R41841 Cognitive communication deficit: Secondary | ICD-10-CM | POA: Diagnosis not present

## 2015-10-08 DIAGNOSIS — Z96641 Presence of right artificial hip joint: Secondary | ICD-10-CM | POA: Diagnosis not present

## 2015-10-08 LAB — PROTIME-INR
INR: 1.17 (ref 0.00–1.49)
PROTHROMBIN TIME: 15.1 s (ref 11.6–15.2)

## 2015-10-08 LAB — CBC WITH DIFFERENTIAL/PLATELET
Basophils Absolute: 0 10*3/uL (ref 0.0–0.1)
Basophils Relative: 0 %
EOS ABS: 0.2 10*3/uL (ref 0.0–0.7)
EOS PCT: 2 %
HCT: 34.4 % — ABNORMAL LOW (ref 36.0–46.0)
Hemoglobin: 11.2 g/dL — ABNORMAL LOW (ref 12.0–15.0)
LYMPHS ABS: 1.2 10*3/uL (ref 0.7–4.0)
LYMPHS PCT: 13 %
MCH: 30.4 pg (ref 26.0–34.0)
MCHC: 32.6 g/dL (ref 30.0–36.0)
MCV: 93.5 fL (ref 78.0–100.0)
MONO ABS: 0.8 10*3/uL (ref 0.1–1.0)
MONOS PCT: 9 %
Neutro Abs: 6.8 10*3/uL (ref 1.7–7.7)
Neutrophils Relative %: 76 %
PLATELETS: 331 10*3/uL (ref 150–400)
RBC: 3.68 MIL/uL — AB (ref 3.87–5.11)
RDW: 14.1 % (ref 11.5–15.5)
WBC: 8.9 10*3/uL (ref 4.0–10.5)

## 2015-10-08 LAB — BASIC METABOLIC PANEL
Anion gap: 14 (ref 5–15)
BUN: 15 mg/dL (ref 6–20)
CHLORIDE: 103 mmol/L (ref 101–111)
CO2: 23 mmol/L (ref 22–32)
CREATININE: 0.79 mg/dL (ref 0.44–1.00)
Calcium: 8.6 mg/dL — ABNORMAL LOW (ref 8.9–10.3)
GFR calc Af Amer: >60 mL/min (ref >60)
GFR calc non Af Amer: >60 mL/min (ref >60)
GLUCOSE: 110 mg/dL — AB (ref 65–99)
POTASSIUM: 3 mmol/L — AB (ref 3.5–5.1)
SODIUM: 140 mmol/L (ref 135–145)

## 2015-10-08 LAB — CBC AND DIFFERENTIAL
HCT: 34 % — AB (ref 36–46)
Hemoglobin: 11.2 g/dL — AB (ref 12.0–16.0)
Platelets: 331 10*3/uL (ref 150–399)
WBC: 8.9 10*3/mL

## 2015-10-08 LAB — ABO/RH: ABO/RH(D): A POS

## 2015-10-08 LAB — TYPE AND SCREEN
ABO/RH(D): A POS
ANTIBODY SCREEN: NEGATIVE

## 2015-10-08 MED ORDER — SODIUM CHLORIDE 0.9 % IV SOLN
INTRAVENOUS | Status: DC
  Administered 2015-10-08: 19:00:00 via INTRAVENOUS

## 2015-10-08 MED ORDER — FENTANYL CITRATE (PF) 100 MCG/2ML IJ SOLN
50.0000 ug | INTRAMUSCULAR | Status: DC | PRN
  Administered 2015-10-08: 50 ug via INTRAVENOUS
  Filled 2015-10-08: qty 2

## 2015-10-08 MED ORDER — FENTANYL CITRATE (PF) 100 MCG/2ML IJ SOLN: 50.0000 ug | INTRAMUSCULAR | Status: DC | PRN

## 2015-10-08 MED ORDER — FERROUS SULFATE 325 (65 FE) MG PO TABS
325.0000 mg | ORAL_TABLET | Freq: Three times a day (TID) | ORAL | Status: DC
  Administered 2015-10-10: 325 mg via ORAL
  Filled 2015-10-08: qty 1

## 2015-10-08 MED ORDER — POLYMYXIN B-TRIMETHOPRIM 10000-0.1 UNIT/ML-% OP SOLN
1.0000 [drp] | OPHTHALMIC | Status: DC
  Administered 2015-10-09 – 2015-10-11 (×12): 1 [drp] via OPHTHALMIC
  Filled 2015-10-08: qty 10

## 2015-10-08 MED ORDER — DEXTROSE 5 % IV SOLN
500.0000 mg | Freq: Four times a day (QID) | INTRAVENOUS | Status: DC | PRN
  Filled 2015-10-08: qty 5

## 2015-10-08 MED ORDER — LACTATED RINGERS IV SOLN
INTRAVENOUS | Status: AC
  Administered 2015-10-09 (×2): via INTRAVENOUS

## 2015-10-08 MED ORDER — POTASSIUM CHLORIDE CRYS ER 20 MEQ PO TBCR
40.0000 meq | EXTENDED_RELEASE_TABLET | Freq: Once | ORAL | Status: AC
  Administered 2015-10-09: 40 meq via ORAL
  Filled 2015-10-08: qty 2

## 2015-10-08 MED ORDER — HEPARIN SODIUM (PORCINE) 5000 UNIT/ML IJ SOLN: 5000.0000 [IU] | Freq: Three times a day (TID) | INTRAMUSCULAR | Status: DC

## 2015-10-08 MED ORDER — BISACODYL 5 MG PO TBEC
5.0000 mg | DELAYED_RELEASE_TABLET | Freq: Every day | ORAL | Status: DC | PRN
Start: 2015-10-08 — End: 2015-10-11

## 2015-10-08 MED ORDER — HYDRALAZINE HCL 20 MG/ML IJ SOLN: 10.0000 mg | INTRAMUSCULAR | Status: DC | PRN

## 2015-10-08 MED ORDER — HYDROCODONE-ACETAMINOPHEN 5-325 MG PO TABS
1.0000 | ORAL_TABLET | Freq: Four times a day (QID) | ORAL | Status: DC | PRN
  Administered 2015-10-09 (×2): 1 via ORAL
  Administered 2015-10-10 – 2015-10-11 (×3): 2 via ORAL
  Filled 2015-10-08 (×3): qty 2
  Filled 2015-10-08: qty 1
  Filled 2015-10-08: qty 2
  Filled 2015-10-08: qty 1

## 2015-10-08 MED ORDER — CEFAZOLIN SODIUM-DEXTROSE 2-3 GM-% IV SOLR
2.0000 g | INTRAVENOUS | Status: AC
  Filled 2015-10-08: qty 50

## 2015-10-08 MED ORDER — MORPHINE SULFATE (PF) 2 MG/ML IV SOLN
0.5000 mg | INTRAVENOUS | Status: DC | PRN
  Administered 2015-10-09 – 2015-10-10 (×4): 0.5 mg via INTRAVENOUS
  Filled 2015-10-08 (×4): qty 1

## 2015-10-08 MED ORDER — FENTANYL CITRATE (PF) 100 MCG/2ML IJ SOLN
50.0000 ug | Freq: Once | INTRAMUSCULAR | Status: AC
  Administered 2015-10-08: 50 ug via INTRAVENOUS
  Filled 2015-10-08: qty 2

## 2015-10-08 MED ORDER — ONDANSETRON HCL 4 MG/2ML IJ SOLN
4.0000 mg | Freq: Once | INTRAMUSCULAR | Status: DC
  Filled 2015-10-08: qty 2

## 2015-10-08 MED ORDER — METHOCARBAMOL 500 MG PO TABS
500.0000 mg | ORAL_TABLET | Freq: Four times a day (QID) | ORAL | Status: DC | PRN
  Administered 2015-10-09 – 2015-10-10 (×2): 500 mg via ORAL
  Filled 2015-10-08 (×2): qty 1

## 2015-10-08 MED ORDER — POLYETHYLENE GLYCOL 3350 17 G PO PACK: 17.0000 g | PACK | Freq: Every day | ORAL | Status: DC | PRN
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
## 2015-10-08 NOTE — ED Notes (Signed)
Pt from home where she lives with her husband.  Pt had a fall on 3/10 and has had hip pain since.  Pt has been ambulatory at home with this pain but today her pain was increased and she could no longer ambulate.  EMS did not find any shortening, some rotation and pt received 124mcg fentanyl PTA from ems.
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
## 2015-10-09 ENCOUNTER — Encounter (HOSPITAL_COMMUNITY)
Admission: EM | Disposition: A | Discharge status: Skilled Nursing Facility | Payer: Self-pay | Source: Home / Self Care | Attending: Internal Medicine

## 2015-10-09 ENCOUNTER — Inpatient Hospital Stay (HOSPITAL_COMMUNITY): Payer: Commercial Managed Care - HMO | Admitting: Critical Care Medicine

## 2015-10-09 ENCOUNTER — Encounter (HOSPITAL_COMMUNITY): Payer: Self-pay | Admitting: Critical Care Medicine

## 2015-10-09 ENCOUNTER — Inpatient Hospital Stay (HOSPITAL_COMMUNITY): Payer: Commercial Managed Care - HMO

## 2015-10-09 DIAGNOSIS — S72009A Fracture of unspecified part of neck of unspecified femur, initial encounter for closed fracture: Secondary | ICD-10-CM | POA: Diagnosis present

## 2015-10-09 HISTORY — PX: ANTERIOR APPROACH HEMI HIP ARTHROPLASTY: SHX6690

## 2015-10-09 LAB — BASIC METABOLIC PANEL
Anion gap: 8 (ref 5–15)
BUN: 12 mg/dL (ref 4–21)
BUN: 12 mg/dL (ref 6–20)
CHLORIDE: 110 mmol/L (ref 101–111)
CO2: 26 mmol/L (ref 22–32)
CREATININE: 0.6 mg/dL (ref 0.5–1.1)
CREATININE: 0.6 mg/dL (ref 0.44–1.00)
Calcium: 8.4 mg/dL — ABNORMAL LOW (ref 8.9–10.3)
GFR calc Af Amer: >60 mL/min (ref >60)
GFR calc non Af Amer: >60 mL/min (ref >60)
GLUCOSE: 97 mg/dL (ref 65–99)
Glucose: 97 mg/dL
POTASSIUM: 3.5 mmol/L (ref 3.4–5.3)
POTASSIUM: 3.5 mmol/L (ref 3.5–5.1)
Sodium: 144 mmol/L (ref 135–145)
Sodium: 144 mmol/L (ref 137–147)

## 2015-10-09 LAB — SURGICAL PCR SCREEN
MRSA, PCR: NEGATIVE
Staphylococcus aureus: NEGATIVE

## 2015-10-09 LAB — VITAMIN B12: VITAMIN B 12: 480 pg/mL (ref 180–914)

## 2015-10-09 LAB — URINALYSIS, ROUTINE W REFLEX MICROSCOPIC
Bilirubin Urine: NEGATIVE
GLUCOSE, UA: NEGATIVE mg/dL
Hgb urine dipstick: NEGATIVE
KETONES UR: NEGATIVE mg/dL
LEUKOCYTES UA: NEGATIVE
NITRITE: NEGATIVE
PH: 5.5 (ref 5.0–8.0)
Protein, ur: NEGATIVE mg/dL
Specific Gravity, Urine: 1.009 (ref 1.005–1.030)

## 2015-10-09 LAB — MAGNESIUM: MAGNESIUM: 2 mg/dL (ref 1.7–2.4)

## 2015-10-09 LAB — IRON AND TIBC
Iron: 20 ug/dL — ABNORMAL LOW (ref 28–170)
SATURATION RATIOS: 10 % — AB (ref 10.4–31.8)
TIBC: 200 ug/dL — ABNORMAL LOW (ref 250–450)
UIBC: 180 ug/dL

## 2015-10-09 LAB — FERRITIN: FERRITIN: 308 ng/mL — AB (ref 11–307)

## 2015-10-09 SURGERY — HEMIARTHROPLASTY, HIP, DIRECT ANTERIOR APPROACH, FOR FRACTURE
Anesthesia: General | Site: Hip | Laterality: Right

## 2015-10-09 MED ORDER — ROCURONIUM BROMIDE 100 MG/10ML IV SOLN
INTRAVENOUS | Status: DC | PRN
  Administered 2015-10-09: 50 mg via INTRAVENOUS

## 2015-10-09 MED ORDER — 0.9 % SODIUM CHLORIDE (POUR BTL) OPTIME
TOPICAL | Status: DC | PRN
  Administered 2015-10-09: 1000 mL

## 2015-10-09 MED ORDER — VECURONIUM BROMIDE 10 MG IV SOLR
INTRAVENOUS | Status: DC | PRN
  Administered 2015-10-09: 3 mg via INTRAVENOUS
  Administered 2015-10-09: 2 mg via INTRAVENOUS

## 2015-10-09 MED ORDER — LACTATED RINGERS IV SOLN
INTRAVENOUS | Status: DC | PRN
  Administered 2015-10-09 (×2): via INTRAVENOUS

## 2015-10-09 MED ORDER — ACETAMINOPHEN 325 MG PO TABS: 650.0000 mg | ORAL_TABLET | Freq: Four times a day (QID) | ORAL | Status: DC | PRN

## 2015-10-09 MED ORDER — FENTANYL CITRATE (PF) 250 MCG/5ML IJ SOLN
INTRAMUSCULAR | Status: AC
  Filled 2015-10-09: qty 5

## 2015-10-09 MED ORDER — ONDANSETRON HCL 4 MG PO TABS: 4.0000 mg | ORAL_TABLET | Freq: Four times a day (QID) | ORAL | Status: DC | PRN

## 2015-10-09 MED ORDER — TRANEXAMIC ACID 1000 MG/10ML IV SOLN
1000.0000 mg | INTRAVENOUS | Status: AC
  Administered 2015-10-09: 1000 mg via INTRAVENOUS
  Filled 2015-10-09: qty 10

## 2015-10-09 MED ORDER — EPHEDRINE SULFATE 50 MG/ML IJ SOLN
INTRAMUSCULAR | Status: DC | PRN
  Administered 2015-10-09 (×2): 10 mg via INTRAVENOUS

## 2015-10-09 MED ORDER — SODIUM CHLORIDE 0.9 % IV BOLUS (SEPSIS): 500.0000 mL | INTRAVENOUS | Status: DC | PRN

## 2015-10-09 MED ORDER — SUGAMMADEX SODIUM 200 MG/2ML IV SOLN
INTRAVENOUS | Status: AC
  Filled 2015-10-09: qty 2

## 2015-10-09 MED ORDER — ACETAMINOPHEN 650 MG RE SUPP: 650.0000 mg | Freq: Four times a day (QID) | RECTAL | Status: DC | PRN

## 2015-10-09 MED ORDER — GLYCOPYRROLATE 0.2 MG/ML IJ SOLN
INTRAMUSCULAR | Status: DC | PRN
  Administered 2015-10-09: 0.4 mg via INTRAVENOUS

## 2015-10-09 MED ORDER — DOCUSATE SODIUM 100 MG PO CAPS
100.0000 mg | ORAL_CAPSULE | Freq: Two times a day (BID) | ORAL | Status: DC
  Administered 2015-10-09 – 2015-10-11 (×4): 100 mg via ORAL
  Filled 2015-10-09 (×4): qty 1

## 2015-10-09 MED ORDER — MIDAZOLAM HCL 2 MG/2ML IJ SOLN
INTRAMUSCULAR | Status: AC
  Filled 2015-10-09: qty 2

## 2015-10-09 MED ORDER — ONDANSETRON HCL 4 MG/2ML IJ SOLN
4.0000 mg | Freq: Four times a day (QID) | INTRAMUSCULAR | Status: DC | PRN
  Administered 2015-10-10: 4 mg via INTRAVENOUS
  Filled 2015-10-09: qty 2

## 2015-10-09 MED ORDER — CEFAZOLIN SODIUM-DEXTROSE 2-3 GM-% IV SOLR
INTRAVENOUS | Status: DC | PRN
  Administered 2015-10-09: 2 g via INTRAVENOUS

## 2015-10-09 MED ORDER — SUGAMMADEX SODIUM 200 MG/2ML IV SOLN
INTRAVENOUS | Status: DC | PRN
  Administered 2015-10-09: 150 mg via INTRAVENOUS

## 2015-10-09 MED ORDER — LACTATED RINGERS IV BOLUS (SEPSIS)
500.0000 mL | INTRAVENOUS | Status: AC | PRN
  Administered 2015-10-09 (×2): 500 mL via INTRAVENOUS
  Filled 2015-10-09: qty 500

## 2015-10-09 MED ORDER — WHITE PETROLATUM GEL
Status: AC
  Administered 2015-10-09: 06:00:00
  Filled 2015-10-09: qty 1

## 2015-10-09 MED ORDER — FENTANYL CITRATE (PF) 100 MCG/2ML IJ SOLN: 25.0000 ug | INTRAMUSCULAR | Status: DC | PRN

## 2015-10-09 MED ORDER — CEFAZOLIN SODIUM 1-5 GM-% IV SOLN
1.0000 g | Freq: Four times a day (QID) | INTRAVENOUS | Status: AC
  Administered 2015-10-09 – 2015-10-10 (×2): 1 g via INTRAVENOUS
  Filled 2015-10-09 (×3): qty 50

## 2015-10-09 MED ORDER — METOCLOPRAMIDE HCL 5 MG PO TABS: 5.0000 mg | ORAL_TABLET | Freq: Three times a day (TID) | ORAL | Status: DC | PRN

## 2015-10-09 MED ORDER — LIDOCAINE HCL (CARDIAC) 20 MG/ML IV SOLN
INTRAVENOUS | Status: DC | PRN
  Administered 2015-10-09: 60 mg via INTRAVENOUS

## 2015-10-09 MED ORDER — ONDANSETRON HCL 4 MG/2ML IJ SOLN
INTRAMUSCULAR | Status: DC | PRN
  Administered 2015-10-09: 4 mg via INTRAVENOUS

## 2015-10-09 MED ORDER — RIVAROXABAN 10 MG PO TABS
10.0000 mg | ORAL_TABLET | Freq: Every day | ORAL | Status: DC
  Administered 2015-10-10 – 2015-10-11 (×2): 10 mg via ORAL
  Filled 2015-10-09 (×2): qty 1

## 2015-10-09 MED ORDER — ALBUMIN HUMAN 5 % IV SOLN
12.5000 g | Freq: Once | INTRAVENOUS | Status: AC
  Administered 2015-10-09: 12.5 g via INTRAVENOUS

## 2015-10-09 MED ORDER — METOCLOPRAMIDE HCL 5 MG/ML IJ SOLN: 5.0000 mg | Freq: Three times a day (TID) | INTRAMUSCULAR | Status: DC | PRN

## 2015-10-09 MED ORDER — FENTANYL CITRATE (PF) 100 MCG/2ML IJ SOLN
INTRAMUSCULAR | Status: DC | PRN
  Administered 2015-10-09: 50 ug via INTRAVENOUS
  Administered 2015-10-09: 25 ug via INTRAVENOUS
  Administered 2015-10-09: 50 ug via INTRAVENOUS
  Administered 2015-10-09: 25 ug via INTRAVENOUS
  Administered 2015-10-09 (×2): 50 ug via INTRAVENOUS

## 2015-10-09 MED ORDER — PROPOFOL 10 MG/ML IV BOLUS
INTRAVENOUS | Status: DC | PRN
  Administered 2015-10-09: 70 mg via INTRAVENOUS

## 2015-10-09 MED ORDER — ARTIFICIAL TEARS OP OINT
TOPICAL_OINTMENT | OPHTHALMIC | Status: DC | PRN
  Administered 2015-10-09: 1 via OPHTHALMIC

## 2015-10-09 MED ORDER — MIDAZOLAM HCL 5 MG/5ML IJ SOLN
INTRAMUSCULAR | Status: DC | PRN
  Administered 2015-10-09 (×2): 1 mg via INTRAVENOUS

## 2015-10-09 MED ORDER — NEOSTIGMINE METHYLSULFATE 10 MG/10ML IV SOLN
INTRAVENOUS | Status: DC | PRN
  Administered 2015-10-09: 3 mg via INTRAVENOUS

## 2015-10-09 MED ORDER — DEXTROSE 5 % IV SOLN
10.0000 mg | INTRAVENOUS | Status: DC | PRN
  Administered 2015-10-09: 25 ug/min via INTRAVENOUS

## 2015-10-09 MED ORDER — ALBUMIN HUMAN 5 % IV SOLN
INTRAVENOUS | Status: AC
  Filled 2015-10-09: qty 250

## 2015-10-09 MED ORDER — PHENYLEPHRINE HCL 10 MG/ML IJ SOLN
INTRAMUSCULAR | Status: DC | PRN
  Administered 2015-10-09 (×4): 80 ug via INTRAVENOUS

## 2015-10-09 MED ORDER — MENTHOL 3 MG MT LOZG: 1.0000 | LOZENGE | OROMUCOSAL | Status: DC | PRN

## 2015-10-09 MED ORDER — PHENOL 1.4 % MT LIQD
1.0000 | OROMUCOSAL | Status: DC | PRN
Start: 2015-10-09 — End: 2015-10-11

## 2015-10-09 MED ORDER — CEFAZOLIN SODIUM-DEXTROSE 2-3 GM-% IV SOLR
INTRAVENOUS | Status: AC
  Filled 2015-10-09: qty 50

## 2015-10-09 SURGICAL SUPPLY — 37 items
BLADE SAW SGTL 18.5X63.X.64 HD (BLADE) ×3 IMPLANT
CAPT HIP TOTAL 2 ×3 IMPLANT
CELLS DAT CNTRL 66122 CELL SVR (MISCELLANEOUS) ×1 IMPLANT
COVER SURGICAL LIGHT HANDLE (MISCELLANEOUS) ×3 IMPLANT
DRAPE C-ARM 42X72 X-RAY (DRAPES) ×3 IMPLANT
DRAPE IMP U-DRAPE 54X76 (DRAPES) ×3 IMPLANT
DRAPE STERI IOBAN 125X83 (DRAPES) ×3 IMPLANT
DRAPE U-SHAPE 47X51 STRL (DRAPES) ×9 IMPLANT
DRSG AQUACEL AG ADV 3.5X10 (GAUZE/BANDAGES/DRESSINGS) ×3 IMPLANT
ELECT BLADE 4.0 EZ CLEAN MEGAD (MISCELLANEOUS) ×3
ELECT REM PT RETURN 9FT ADLT (ELECTROSURGICAL) ×3
ELECTRODE BLDE 4.0 EZ CLN MEGD (MISCELLANEOUS) ×1 IMPLANT
ELECTRODE REM PT RTRN 9FT ADLT (ELECTROSURGICAL) ×1 IMPLANT
GLOVE SURG SYN 7.5  E (GLOVE) ×2
GLOVE SURG SYN 7.5 E (GLOVE) ×1 IMPLANT
GOWN SRG XL XLNG 56XLVL 4 (GOWN DISPOSABLE) ×1 IMPLANT
GOWN STRL NON-REIN XL XLG LVL4 (GOWN DISPOSABLE) ×2
GOWN STRL REUS W/ TWL LRG LVL3 (GOWN DISPOSABLE) IMPLANT
GOWN STRL REUS W/TWL LRG LVL3 (GOWN DISPOSABLE)
HANDPIECE INTERPULSE COAX TIP (DISPOSABLE) ×2
IV NS 1000ML (IV SOLUTION) ×2
IV NS 1000ML BAXH (IV SOLUTION) ×1 IMPLANT
IV NS IRRIG 3000ML ARTHROMATIC (IV SOLUTION) ×3 IMPLANT
KIT BASIN OR (CUSTOM PROCEDURE TRAY) ×3 IMPLANT
MARKER SKIN DUAL TIP RULER LAB (MISCELLANEOUS) ×3 IMPLANT
PACK TOTAL JOINT (CUSTOM PROCEDURE TRAY) ×3 IMPLANT
RTRCTR WOUND ALEXIS 18CM MED (MISCELLANEOUS) ×3
SAW OSC TIP CART 19.5X105X1.3 (SAW) ×3 IMPLANT
SEALER BIPOLAR AQUA 6.0 (INSTRUMENTS) IMPLANT
SET HNDPC FAN SPRY TIP SCT (DISPOSABLE) ×1 IMPLANT
SUT ETHIBOND 2 V 37 (SUTURE) ×3 IMPLANT
SUT ETHIBOND NAB CT1 #1 30IN (SUTURE) ×9 IMPLANT
SUT VIC AB 1 CT1 27 (SUTURE) ×2
SUT VIC AB 1 CT1 27XBRD ANBCTR (SUTURE) ×1 IMPLANT
SUT VIC AB 2-0 CT1 27 (SUTURE) ×2
SUT VIC AB 2-0 CT1 TAPERPNT 27 (SUTURE) ×1 IMPLANT
TOWEL OR 17X26 10 PK STRL BLUE (TOWEL DISPOSABLE) ×3 IMPLANT

## 2015-10-09 NOTE — Transfer of Care (Signed)
Immediate Anesthesia Transfer of Care Note  Patient: Kelsey Hebert  Procedure(s) Performed: Procedure(s): ANTERIOR APPROACH HEMI HIP ARTHROPLASTY (Right)  Patient Location: PACU  Anesthesia Type:General  Level of Consciousness: sedated  Airway & Oxygen Therapy: Patient Spontanous Breathing and Patient connected to face mask oxygen  Post-op Assessment: Report given to RN and Post -op Vital signs reviewed and stable  Post vital signs: Reviewed and stable  Last Vitals:  Filed Vitals:   10/09/15 0500 10/09/15 0959  BP: 108/53 109/59  Pulse: 69 77  Temp: 37.2 C 37.5 C  Resp: 20 16    Complications: No apparent anesthesia complications

## 2015-10-09 NOTE — Brief Op Note (Signed)
10/08/2015 - 10/09/2015  2:52 PM  PATIENT:  Valeta Harms  80 y.o. female  PRE-OPERATIVE DIAGNOSIS:  right hip fracture  POST-OPERATIVE DIAGNOSIS:  right hip fracture  PROCEDURE:  Procedure(s): ANTERIOR APPROACH HEMI HIP ARTHROPLASTY  SURGEON:  Surgeon(s): Meredith Pel, MD  ASSISTANT: Benjiman Core PA  ANESTHESIA:   general  EBL: 500 ml    Total I/O In: 1400 [I.V.:1400] Out: 1175 [Urine:675; Blood:500]  BLOOD ADMINISTERED: none  DRAINS: none   LOCAL MEDICATIONS USED:  none  SPECIMEN:  No Specimen  COUNTS:  YES  TOURNIQUET:  * No tourniquets in log *  DICTATION: .Other Dictation: Dictation Number [id number]  PLAN OF CARE: Admit to inpatient   PATIENT DISPOSITION:  PACU - hemodynamically stable

## 2015-10-09 NOTE — Progress Notes (Signed)
SLP Cancellation Note  Patient Details Name: Kelsey Hebert MRN: [id number] DOB: 25-Jun-1936   Cancelled treatment:       Reason Eval/Treat Not Completed:  Pt in the OR.  ST will follow up next date in attempt to complete swallowing evaluation.    Shelly Flatten, MA, [facility] SLP [phone] Lamar Sprinkles 10/09/2015, 1:47 PM

## 2015-10-09 NOTE — Progress Notes (Signed)
Orthopedic Tech Progress Note Patient Details:  [name] 12-04-1935 [id number] Patient family states unable to ohf. Patient ID: Kelsey Hebert, female   DOB: 1935/10/08, 80 y.o.   MRN: [id number]   [name] 10/09/2015, 6:00 PM

## 2015-10-09 NOTE — Progress Notes (Signed)
Plan hip replacement for fracture this am - right side All ? answered

## 2015-10-09 NOTE — Anesthesia Postprocedure Evaluation (Signed)
Anesthesia Post Note  Patient: Kelsey Hebert  Procedure(s) Performed: Procedure(s) (LRB): ANTERIOR APPROACH HEMI HIP ARTHROPLASTY (Right)  Patient location during evaluation: PACU Anesthesia Type: General Level of consciousness: awake and alert Pain management: pain level controlled Vital Signs Assessment: post-procedure vital signs reviewed and stable Respiratory status: spontaneous breathing, nonlabored ventilation and respiratory function stable Cardiovascular status: blood pressure returned to baseline and stable Postop Assessment: no signs of nausea or vomiting Anesthetic complications: no    Last Vitals:  Filed Vitals:   10/09/15 1715 10/09/15 1724  BP: 98/81 100/48  Pulse: 57 69  Temp:  36.1 C  Resp: 18 17    Last Pain:  Filed Vitals:   10/09/15 1729  PainSc: 0-No pain                 Clemmie Marxen,W. EDMOND

## 2015-10-09 NOTE — Anesthesia Preprocedure Evaluation (Addendum)
Anesthesia Evaluation  Patient identified by MRN, date of birth, ID band Patient awake    Reviewed: Allergy & Precautions, H&P , NPO status , Patient's Chart, lab work & pertinent test results  Airway Mallampati: II  TM Distance: >3 FB Neck ROM: Full    Dental no notable dental hx. (+) Edentulous Upper, Partial Lower, Dental Advisory Given   Pulmonary neg pulmonary ROS,    Pulmonary exam normal breath sounds clear to auscultation       Cardiovascular hypertension, Pt. on medications  Rhythm:Regular Rate:Normal     Neuro/Psych negative neurological ROS  negative psych ROS   GI/Hepatic negative GI ROS, Neg liver ROS,   Endo/Other  negative endocrine ROS  Renal/GU negative Renal ROS  negative genitourinary   Musculoskeletal  (+) Arthritis , Osteoarthritis,    Abdominal   Peds  Hematology negative hematology ROS (+)   Anesthesia Other Findings   Reproductive/Obstetrics negative OB ROS                            Anesthesia Physical Anesthesia Plan  ASA: II  Anesthesia Plan: General   Post-op Pain Management:    Induction: Intravenous  Airway Management Planned: Oral ETT  Additional Equipment:   Intra-op Plan:   Post-operative Plan: Extubation in OR  Informed Consent: I have reviewed the patients History and Physical, chart, labs and discussed the procedure including the risks, benefits and alternatives for the proposed anesthesia with the patient or authorized representative who has indicated his/her understanding and acceptance.   Dental advisory given  Plan Discussed with: CRNA  Anesthesia Plan Comments:         Anesthesia Quick Evaluation

## 2015-10-09 NOTE — Anesthesia Procedure Notes (Signed)
Procedure Name: Intubation Date/Time: 10/09/2015 11:55 AM Performed by: Clearnce Sorrel Pre-anesthesia Checklist: Patient identified, Timeout performed, Emergency Drugs available, Suction available and Patient being monitored Patient Re-evaluated:Patient Re-evaluated prior to inductionOxygen Delivery Method: Circle system utilized Preoxygenation: Pre-oxygenation with 100% oxygen Intubation Type: IV induction Ventilation: Mask ventilation without difficulty Laryngoscope Size: Mac and 3 Grade View: Grade I Tube type: Oral Tube size: 7.0 mm Number of attempts: 1 Airway Equipment and Method: Stylet Placement Confirmation: ETT inserted through vocal cords under direct vision,  breath sounds checked- equal and bilateral and positive ETCO2 Secured at: 21 cm Tube secured with: Tape Dental Injury: Teeth and Oropharynx as per pre-operative assessment
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
## 2015-10-09 NOTE — Progress Notes (Signed)
Attempted to see patient earlier and a day unfortunately patient had are been taken down for surgery patient remains in surgery/recovery. Unable to evaluate patient. No charge

## 2015-10-10 ENCOUNTER — Encounter (HOSPITAL_COMMUNITY): Payer: Self-pay | Admitting: Orthopedic Surgery

## 2015-10-10 DIAGNOSIS — W19XXXD Unspecified fall, subsequent encounter: Secondary | ICD-10-CM

## 2015-10-10 DIAGNOSIS — I10 Essential (primary) hypertension: Secondary | ICD-10-CM

## 2015-10-10 DIAGNOSIS — S72001A Fracture of unspecified part of neck of right femur, initial encounter for closed fracture: Secondary | ICD-10-CM

## 2015-10-10 DIAGNOSIS — H109 Unspecified conjunctivitis: Secondary | ICD-10-CM

## 2015-10-10 DIAGNOSIS — D649 Anemia, unspecified: Secondary | ICD-10-CM

## 2015-10-10 DIAGNOSIS — E876 Hypokalemia: Secondary | ICD-10-CM

## 2015-10-10 DIAGNOSIS — E43 Unspecified severe protein-calorie malnutrition: Secondary | ICD-10-CM | POA: Diagnosis present

## 2015-10-10 DIAGNOSIS — E440 Moderate protein-calorie malnutrition: Secondary | ICD-10-CM

## 2015-10-10 LAB — BASIC METABOLIC PANEL
Anion gap: 8 (ref 5–15)
BUN: 12 mg/dL (ref 4–21)
BUN: 12 mg/dL (ref 6–20)
CO2: 26 mmol/L (ref 22–32)
Calcium: 7.9 mg/dL — ABNORMAL LOW (ref 8.9–10.3)
Chloride: 105 mmol/L (ref 101–111)
Creatinine, Ser: 0.76 mg/dL (ref 0.44–1.00)
Creatinine: 0.8 mg/dL (ref 0.5–1.1)
GFR calc Af Amer: >60 mL/min (ref >60)
GLUCOSE: 140 mg/dL — AB (ref 65–99)
Glucose: 140 mg/dL
POTASSIUM: 4.3 mmol/L (ref 3.5–5.1)
Potassium: 4.3 mmol/L (ref 3.4–5.3)
SODIUM: 139 mmol/L (ref 137–147)
Sodium: 139 mmol/L (ref 135–145)

## 2015-10-10 LAB — CBC
HCT: 26.8 % — ABNORMAL LOW (ref 36.0–46.0)
Hemoglobin: 8.5 g/dL — ABNORMAL LOW (ref 12.0–15.0)
MCH: 30.2 pg (ref 26.0–34.0)
MCHC: 31.7 g/dL (ref 30.0–36.0)
MCV: 95.4 fL (ref 78.0–100.0)
PLATELETS: 221 10*3/uL (ref 150–400)
RBC: 2.81 MIL/uL — AB (ref 3.87–5.11)
RDW: 14.2 % (ref 11.5–15.5)
WBC: 8.1 10*3/uL (ref 4.0–10.5)

## 2015-10-10 LAB — FOLATE RBC
FOLATE, HEMOLYSATE: 378.2 ng/mL
FOLATE, RBC: 1071 ng/mL (ref >498)
HEMATOCRIT: 35.3 % (ref 34.0–46.6)

## 2015-10-10 LAB — CBC AND DIFFERENTIAL
HEMATOCRIT: 268 % — AB (ref 36–46)
HEMOGLOBIN: 8.5 g/dL — AB (ref 12.0–16.0)
Platelets: 221 10*3/uL (ref 150–399)
WBC: 8.1 10^3/mL

## 2015-10-10 MED ORDER — ENSURE ENLIVE PO LIQD
237.0000 mL | Freq: Every day | ORAL | Status: DC
  Administered 2015-10-10: 237 mL via ORAL

## 2015-10-10 MED ORDER — SODIUM CHLORIDE 0.9 % IV BOLUS (SEPSIS)
1000.0000 mL | Freq: Once | INTRAVENOUS | Status: AC
  Administered 2015-10-10: 1000 mL via INTRAVENOUS

## 2015-10-10 NOTE — Care Management Note (Signed)
Case Management Note  Patient Details  Name: Kelsey Hebert MRN: [id number] Date of Birth: [birth year]  Subjective/Objective:  80 yr old female s/p right total hip arthroplasty, related to a fall.            Action/Plan: Patient has been referred to social worker for SNF placement for shortterm rehab.   Expected Discharge Date:    10/11/15              Expected Discharge Plan:   [place]  In-House Referral:  Clinical Social Work  Discharge planning Services  CM Consult  Post Acute Care Choice:    Choice offered to:  NA  DME Arranged:  N/A DME Agency:  NA  HH Arranged:  NA HH Agency:  NA  Status of Service:  Completed, signed off  Medicare Important Message Given:    Date Medicare IM Given:    Medicare IM give by:    Date Additional Medicare IM Given:    Additional Medicare Important Message give by:     If discussed at [place] of Stay Meetings, dates discussed:    Additional Comments:  Ninfa Meeker, RN 10/10/2015, 10:07 AM

## 2015-10-10 NOTE — Progress Notes (Signed)
Initial Nutrition Assessment  DOCUMENTATION CODES:   Non-severe (moderate) malnutrition in context of acute illness/injury  INTERVENTION:  Provide Ensure Enlive po once daily, each supplement provides 350 kcal and 20 grams of protein.  Encourage adequate PO intake.   NUTRITION DIAGNOSIS:   Malnutrition related to  (illness) as evidenced by percent weight loss, moderate depletions of muscle mass.  GOAL:   Patient will meet greater than or equal to 90% of their needs  MONITOR:   PO intake, Supplement acceptance, Weight trends, Labs, I & O's  REASON FOR ASSESSMENT:   Consult Hip fracture protocol  ASSESSMENT:   80 y.o. female with PMH of hypertension, osteoarthritis, and osteopenia who presents to the ED with severe right hip pain following a fall approximately one week prior.   PROCEDURE:(3/19): ANTERIOR APPROACH HEMI HIP ARTHROPLASTY   Pt reports appetite is just "so so". Meal completion has been 75%. Pt reports eating well PTA with at least 2-3 meals daily with an Ensure on occasion. Per Epic weight records, pt with a 11% weight loss in 2 months. Pt unaware of weight lost. Pt agreeable to Ensure to aid in caloric and protein needs. RD to order.   Nutrition-Focused physical exam completed. Findings are no fat depletion, moderate muscle depletion, and no edema.   Labs and medications reviewed.   Diet Order:  Diet regular Room service appropriate?: Yes; Fluid consistency:: Thin  Skin:   (Incision on R thigh)  Last BM:  3/18  Height:   Ht Readings from Last 1 Encounters:  07/09/15 5\' 4"  (1.626 m)    Weight:   Wt Readings from Last 1 Encounters:  10/08/15 137 lb 12.6 oz (62.5 kg)    Ideal Body Weight:  54.5 kg  BMI:  Body mass index is 23.64 kg/(m^2).  Estimated Nutritional Needs:   Kcal:  1700-1900  Protein:  75-85 grams  Fluid:  1.7 - 1.9 L/day  EDUCATION NEEDS:   No education needs identified at this time  Corrin Parker, MS, RD, LDN Pager #  [phone] After hours/ weekend pager # [phone]

## 2015-10-10 NOTE — Progress Notes (Signed)
Subjective: Pt stable - pain ok   Objective: Vital signs in last 24 hours: Temp:  [97 F (36.1 C)-99.1 F (37.3 C)] 99.1 F (37.3 C) (03/20 0815) Pulse Rate:  [56-78] 78 (03/20 0450) Resp:  [15-23] 18 (03/20 0450) BP: (75-100)/(37-81) 82/41 mmHg (03/20 0450) SpO2:  [96 %-100 %] 98 % (03/20 0450)  Intake/Output from previous day: 03/19 0701 - 03/20 0700 In: 3090 [P.O.:640; I.V.:1700; IV Piggyback:750] Out: 2025 [Urine:1525; Blood:500] Intake/Output this shift: Total I/O In: 240 [P.O.:240] Out: -   Exam:  Sensation intact distally  Labs:  Recent Labs  10/08/15 2050 10/10/15 0234  HGB 11.2* 8.5*    Recent Labs  10/08/15 2050 10/10/15 0234  WBC 8.9 8.1  RBC 3.68* 2.81*  HCT 34.4* 26.8*  PLT 331 221    Recent Labs  10/09/15 0429 10/10/15 0234  NA 144 139  K 3.5 4.3  CL 110 105  CO2 26 26  BUN 12 12  CREATININE 0.60 0.76  GLUCOSE 97 140*  CALCIUM 8.4* 7.9*    Recent Labs  10/08/15 2050  INR 1.17    Assessment/Plan: Plan PT and mobilization today - on xarelto   [name],[name] 10/10/2015, 11:41 AM
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
## 2015-10-10 NOTE — Progress Notes (Signed)
[facility] PROGRESS NOTE   TERRIONA HARNESS [id number] DOB: July 28, 1935 DOA: 10/08/2015 PCP: Dena Billet BETH, PA-C  HPI/Subjective: Seen with her husband at bedside, complains about poor appetite. Blood pressure in the low side noted bolus of 1 L of IV fluids  Assessment/Plan: Principal Problem:   Closed right hip fracture [facility]) Active Problems:   Essential hypertension   Hypokalemia   Conjunctivitis   Normocytic anemia   Fall   Hip fracture (HCC)    Right subcapital femoral neck fracture  Right hip fracture, seen by orthopedics, status post right total hip arthroplasty. Started on Xarelto for DVT prophylaxis. Narcotics for pain control. CSW for SNF placement.  Hypokalemia  This is repleted with oral supplements, potassium today is 4.3.  Anemia  This is likely mixture of acute blood loss anemia and hemodilution secondary to IV fluids. Patient is going to be on DVT prophylaxis so check FOBT. Hemoglobin today is 8.5 dropped from 11.2 on 10/08/2015. Check CBC in a.m.  Hypertension  Blood pressure medications held prior to surgery, patient has low blood pressure now. A bolus of fluid given earlier today.  Conjunctivitis  - Bilateral with thick yellow discharge  - Concern for bacterial etiology  - No vision change; no pathology in orbits or paranasal sinuses identified on head CT  - Treat with trimethoprim-polymyxin ophthalmic   Moderate protein calorie malnutrition Dietitian to see.   Code Status: Full Code Family Communication: Plan discussed with the patient. Disposition Plan: Remains inpatient Diet: Diet regular Room service appropriate?: Yes; Fluid consistency:: Thin  Consultants:  Orthopedics  Procedures:  Right total hip arthroplasty  Antibiotics:  Ancef perioperatively   Objective: Filed Vitals:   10/10/15 0450 10/10/15 0815  BP: 82/41   Pulse: 78   Temp: 98 F (36.7 C) 99.1 F (37.3 C)  Resp: 18     Intake/Output  Summary (Last 24 hours) at 10/10/15 1223 Last data filed at 10/10/15 0819  Gross per 24 hour  Intake   3330 ml  Output   1575 ml  Net   1755 ml   Filed Weights   10/08/15 2330  Weight: 62.5 kg (137 lb 12.6 oz)    Exam: General: Alert and awake, oriented x3, not in any acute distress. HEENT: anicteric sclera, pupils reactive to light and accommodation, EOMI CVS: S1-S2 clear, no murmur rubs or gallops Chest: clear to auscultation bilaterally, no wheezing, rales or rhonchi Abdomen: soft nontender, nondistended, normal bowel sounds, no organomegaly Extremities: no cyanosis, clubbing or edema noted bilaterally Neuro: Cranial nerves II-XII intact, no focal neurological deficits  Data Reviewed: Basic Metabolic Panel:  Recent Labs Lab 10/08/15 2050 10/08/15 2341 10/09/15 0429 10/10/15 0234  NA 140  --  144 139  K 3.0*  --  3.5 4.3  CL 103  --  110 105  CO2 23  --  26 26  GLUCOSE 110*  --  97 140*  BUN 15  --  12 12  CREATININE 0.79  --  0.60 0.76  CALCIUM 8.6*  --  8.4* 7.9*  MG  --  2.0  --   --    Liver Function Tests: No results for input(s): AST, ALT, ALKPHOS, BILITOT, PROT, ALBUMIN in the last 168 hours. No results for input(s): LIPASE, AMYLASE in the last 168 hours. No results for input(s): AMMONIA in the last 168 hours. CBC:  Recent Labs Lab 10/08/15 2050 10/10/15 0234  WBC 8.9 8.1  NEUTROABS 6.8  --   HGB 11.2* 8.5*  HCT  34.4* 26.8*  MCV 93.5 95.4  PLT 331 221   Cardiac Enzymes: No results for input(s): CKTOTAL, CKMB, CKMBINDEX, TROPONINI in the last 168 hours. BNP (last 3 results)  Recent Labs  08/04/15 0015  BNP 43.9    ProBNP (last 3 results) No results for input(s): PROBNP in the last 8760 hours.  CBG: No results for input(s): GLUCAP in the last 168 hours.  Micro Recent Results (from the past 240 hour(s))  Surgical pcr screen     Status: None   Collection Time: 10/09/15  1:25 AM  Result Value Ref Range Status   MRSA, PCR NEGATIVE  NEGATIVE Final   Staphylococcus aureus NEGATIVE NEGATIVE Final    Comment:        The Xpert SA Assay (FDA approved for NASAL specimens in patients over 23 years of age), is one component of a comprehensive surveillance program.  Test performance has been validated by [facility] for patients greater than or equal to 14 year old. It is not intended to diagnose infection nor to guide or monitor treatment.      Studies: Dg Chest 1 View  10/08/2015  CLINICAL DATA:  Right hip fracture EXAM: CHEST 1 VIEW COMPARISON:  08/04/2015 FINDINGS: The heart size and mediastinal contours are within normal limits. Both lungs are clear. The visualized skeletal structures are unremarkable. IMPRESSION: No active disease. Electronically Signed   By: Inez Catalina M.D.   On: 10/08/2015 20:16   Ct Head Wo Contrast  10/08/2015  CLINICAL DATA:  Status post fall, with concern for head or cervical spine injury. Initial encounter. EXAM: CT HEAD WITHOUT CONTRAST CT CERVICAL SPINE WITHOUT CONTRAST TECHNIQUE: Multidetector CT imaging of the head and cervical spine was performed following the standard protocol without intravenous contrast. Multiplanar CT image reconstructions of the cervical spine were also generated. COMPARISON:  None. FINDINGS: CT HEAD FINDINGS There is no evidence of acute infarction, mass lesion, or intra- or extra-axial hemorrhage on CT. Scattered periventricular and subcortical white matter change likely reflects small vessel ischemic microangiopathy. The posterior fossa, including the cerebellum, brainstem and fourth ventricle, is within normal limits. The third and lateral ventricles, and basal ganglia are unremarkable in appearance. The cerebral hemispheres are symmetric in appearance, with normal gray-white differentiation. No mass effect or midline shift is seen. There is no evidence of fracture; visualized osseous structures are unremarkable in appearance. The orbits are within normal limits. The  paranasal sinuses and mastoid air cells are well-aerated. No significant soft tissue abnormalities are seen. CT CERVICAL SPINE FINDINGS There is no evidence of fracture or subluxation. Vertebral bodies demonstrate normal height and alignment. Multilevel disc space narrowing is noted along the cervical spine, with scattered anterior and posterior disc osteophyte complexes. Prevertebral soft tissues are within normal limits. The thyroid gland is unremarkable in appearance. The visualized lung apices are clear. No significant soft tissue abnormalities are seen. IMPRESSION: 1. No evidence of traumatic intracranial injury or fracture. 2. No evidence of fracture or subluxation along the cervical spine. 3. Scattered small vessel ischemic microangiopathy. 4. Mild degenerative change along the cervical spine. Electronically Signed   By: Garald Balding M.D.   On: 10/08/2015 21:43   Ct Cervical Spine Wo Contrast  10/08/2015  CLINICAL DATA:  Status post fall, with concern for head or cervical spine injury. Initial encounter. EXAM: CT HEAD WITHOUT CONTRAST CT CERVICAL SPINE WITHOUT CONTRAST TECHNIQUE: Multidetector CT imaging of the head and cervical spine was performed following the standard protocol without intravenous contrast. Multiplanar  CT image reconstructions of the cervical spine were also generated. COMPARISON:  None. FINDINGS: CT HEAD FINDINGS There is no evidence of acute infarction, mass lesion, or intra- or extra-axial hemorrhage on CT. Scattered periventricular and subcortical white matter change likely reflects small vessel ischemic microangiopathy. The posterior fossa, including the cerebellum, brainstem and fourth ventricle, is within normal limits. The third and lateral ventricles, and basal ganglia are unremarkable in appearance. The cerebral hemispheres are symmetric in appearance, with normal gray-white differentiation. No mass effect or midline shift is seen. There is no evidence of fracture; visualized  osseous structures are unremarkable in appearance. The orbits are within normal limits. The paranasal sinuses and mastoid air cells are well-aerated. No significant soft tissue abnormalities are seen. CT CERVICAL SPINE FINDINGS There is no evidence of fracture or subluxation. Vertebral bodies demonstrate normal height and alignment. Multilevel disc space narrowing is noted along the cervical spine, with scattered anterior and posterior disc osteophyte complexes. Prevertebral soft tissues are within normal limits. The thyroid gland is unremarkable in appearance. The visualized lung apices are clear. No significant soft tissue abnormalities are seen. IMPRESSION: 1. No evidence of traumatic intracranial injury or fracture. 2. No evidence of fracture or subluxation along the cervical spine. 3. Scattered small vessel ischemic microangiopathy. 4. Mild degenerative change along the cervical spine. Electronically Signed   By: Garald Balding M.D.   On: 10/08/2015 21:43   Dg Hip Port Unilat With Pelvis 1v Right  10/09/2015  CLINICAL DATA:  Status post hip replacement for a femoral neck fracture. EXAM: DG HIP (WITH OR WITHOUT PELVIS) 1V PORT RIGHT COMPARISON:  Yesterday and earlier today. FINDINGS: Right total hip prosthesis in satisfactory position and alignment. The lateral acetabular screw extends beyond the lateral cortex of the superior acetabulum. Faint arterial calcifications are noted. Also noted are calcified uterine fibroids. IMPRESSION: Normal position and alignment of the right total hip prosthetic components. The more laterally located acetabular screw extends beyond the lateral cortex of the superior acetabulum. Electronically Signed   By: Claudie Revering M.D.   On: 10/09/2015 17:44   Dg Hip Operative Unilat With Pelvis Right  10/09/2015  CLINICAL DATA:  Right femoral neck fracture EXAM: OPERATIVE right HIP (WITH PELVIS IF PERFORMED) 2 VIEWS TECHNIQUE: Fluoroscopic spot image(s) were submitted for  interpretation post-operatively. 58 seconds of fluoroscopy was utilized. COMPARISON:  10/08/2015 FINDINGS: A right hip prosthesis is now seen in satisfactory position. Calcified uterine fibroids are again noted. No other focal abnormality is seen. IMPRESSION: Status post right hip replacement Electronically Signed   By: Inez Catalina M.D.   On: 10/09/2015 15:03   Dg Hip Unilat  With Pelvis 2-3 Views Right  10/08/2015  CLINICAL DATA:  Pt from home where she lives with her husband. Pt had a fall on 3/10 and has had hip pain since. Pt has been ambulatory at home with this pain but today her pain was increased and she could no longer ambulate. EXAM: DG HIP (WITH OR WITHOUT PELVIS) 2-3V RIGHT COMPARISON:  CT of the abdomen and pelvis 07/09/2015 FINDINGS: There is an acute fracture of the right subcapital femoral neck, associated varus angulation at the fracture site. No evidence for dislocation. There is linear lucency along the left inferior pubic ramus, raising the question of minimally displaced fracture in this region. Bowel gas pattern is nonobstructed. Bowel gas identified overlying the right iliac wing, consistent with ventral hernia as identified on previous CT exam. Coarse rounded calcifications are identified in the central pelvis, likely  representing fibroids. IMPRESSION: 1. Right subcapital femoral neck fracture. 2. Possible fracture of the left inferior pubic ramus. Electronically Signed   By: Nolon Nations M.D.   On: 10/08/2015 20:19    Scheduled Meds: . docusate sodium  100 mg Oral BID  . rivaroxaban  10 mg Oral Q breakfast  . sodium chloride  1,000 mL Intravenous Once  . trimethoprim-polymyxin b  1 drop Both Eyes 6 times per day   Continuous Infusions:      Time spent: 35 minutes    [facility]  [facility] Pager [phone] If 7PM-7AM, please contact night-coverage at [email or web address], password [facility] 10/10/2015, 12:23 PM  LOS: 2 days
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
## 2015-10-10 NOTE — Discharge Instructions (Addendum)
Information on my medicine - XARELTO (Rivaroxaban)  This medication education was reviewed with me or my healthcare representative as part of my discharge preparation.  The pharmacist that spoke with me during my hospital stay was:  Romona Curls, [facility]  Why was Xarelto prescribed for you? Xarelto was prescribed for you to reduce the risk of blood clots forming after orthopedic surgery. The medical term for these abnormal blood clots is venous thromboembolism (VTE).  What do you need to know about xarelto ? Take your Xarelto ONCE DAILY at the same time every day. You may take it either with or without food.  If you have difficulty swallowing the tablet whole, you may crush it and mix in applesauce just prior to taking your dose.  Take Xarelto exactly as prescribed by your doctor and DO NOT stop taking Xarelto without talking to the doctor who prescribed the medication.  Stopping without other VTE prevention medication to take the place of Xarelto may increase your risk of developing a clot.  After discharge, you should have regular check-up appointments with your healthcare provider that is prescribing your Xarelto.    What do you do if you miss a dose? If you miss a dose, take it as soon as you remember on the same day then continue your regularly scheduled once daily regimen the next day. Do not take two doses of Xarelto on the same day.   Important Safety Information A possible side effect of Xarelto is bleeding. You should call your healthcare provider right away if you experience any of the following: ? Bleeding from an injury or your nose that does not stop. ? Unusual colored urine (red or dark brown) or unusual colored stools (red or black). ? Unusual bruising for unknown reasons. ? A serious fall or if you hit your head (even if there is no bleeding).  Some medicines may interact with Xarelto and might increase your risk of bleeding while on Xarelto. To help avoid this,  consult your healthcare provider or pharmacist prior to using any new prescription or non-prescription medications, including herbals, vitamins, non-steroidal anti-inflammatory drugs (NSAIDs) and supplements.  This website has more information on Xarelto: [email or web address]   INSTRUCTIONS AFTER JOINT REPLACEMENT   o Remove items at home which could result in a fall. This includes throw rugs or furniture in walking pathways o ICE to the affected joint every three hours while awake for 30 minutes at a time, for at least the first 3-5 days, and then as needed for pain and swelling.  Continue to use ice for pain and swelling. You may notice swelling that will progress down to the foot and ankle.  This is normal after surgery.  Elevate your leg when you are not up walking on it.   o Continue to use the breathing machine you got in the hospital (incentive spirometer) which will help keep your temperature down.  It is common for your temperature to cycle up and down following surgery, especially at night when you are not up moving around and exerting yourself.  The breathing machine keeps your lungs expanded and your temperature down.   DIET:  As you were doing prior to hospitalization, we recommend a well-balanced diet.  DRESSING / WOUND CARE / SHOWERING  Keep the surgical dressing until follow up.  The dressing is water proof, so you can shower without any extra covering.  IF THE DRESSING FALLS OFF or the wound gets wet inside, change the dressing with sterile gauze.  Please use good hand washing techniques before changing the dressing.  Do not use any lotions or creams on the incision until instructed by your surgeon.    ACTIVITY  o Increase activity slowly as tolerated, but follow the weight bearing instructions below.   o No driving for 6 weeks or until further direction given by your physician.  You cannot drive while taking narcotics.  o No lifting or carrying greater than 10 lbs. until further  directed by your surgeon. o Avoid periods of inactivity such as sitting longer than an hour when not asleep. This helps prevent blood clots.  o You may return to work once you are authorized by your doctor.     WEIGHT BEARING   Weight bearing as tolerated with assist device (walker, cane, etc) as directed, use it as long as suggested by your surgeon or therapist, typically at least 4-6 weeks.   EXERCISES  Results after joint replacement surgery are often greatly improved when you follow the exercise, range of motion and muscle strengthening exercises prescribed by your doctor. Safety measures are also important to protect the joint from further injury. Any time any of these exercises cause you to have increased pain or swelling, decrease what you are doing until you are comfortable again and then slowly increase them. If you have problems or questions, call your caregiver or physical therapist for advice.   Rehabilitation is important following a joint replacement. After just a few days of immobilization, the muscles of the leg can become weakened and shrink (atrophy).  These exercises are designed to build up the tone and strength of the thigh and leg muscles and to improve motion. Often times heat used for twenty to thirty minutes before working out will loosen up your tissues and help with improving the range of motion but do not use heat for the first two weeks following surgery (sometimes heat can increase post-operative swelling).   These exercises can be done on a training (exercise) mat, on the floor, on a table or on a bed. Use whatever works the best and is most comfortable for you.    Use music or television while you are exercising so that the exercises are a pleasant break in your day. This will make your life better with the exercises acting as a break in your routine that you can look forward to.   Perform all exercises about fifteen times, three times per day or as directed.  You  should exercise both the operative leg and the other leg as well.  Exercises include:    Quad Sets - Tighten up the muscle on the front of the thigh (Quad) and hold for 5-10 seconds.    Straight Leg Raises - With your knee straight (if you were given a brace, keep it on), lift the leg to 60 degrees, hold for 3 seconds, and slowly lower the leg.  Perform this exercise against resistance later as your leg gets stronger.   Leg Slides: Lying on your back, slowly slide your foot toward your buttocks, bending your knee up off the floor (only go as far as is comfortable). Then slowly slide your foot back down until your leg is flat on the floor again.   Angel Wings: Lying on your back spread your legs to the side as far apart as you can without causing discomfort.   Hamstring Strength:  Lying on your back, push your heel against the floor with your leg straight by tightening up the  muscles of your buttocks.  Repeat, but this time bend your knee to a comfortable angle, and push your heel against the floor.  You may put a pillow under the heel to make it more comfortable if necessary.  ° °A rehabilitation program following joint replacement surgery can speed recovery and prevent re-injury in the future due to weakened muscles. Contact your doctor or a physical therapist for more information on knee rehabilitation.  ° ° °CONSTIPATION ° °Constipation is defined medically as fewer than three stools per week and severe constipation as less than one stool per week.  Even if you have a regular bowel pattern at home, your normal regimen is likely to be disrupted due to multiple reasons following surgery.  Combination of anesthesia, postoperative narcotics, change in appetite and fluid intake all can affect your bowels.  ° °YOU MUST use at least one of the following options; they are listed in order of increasing strength to get the job done.  They are all available over the counter, and you may need to use some,  POSSIBLY even all of these options:   ° °Drink plenty of fluids (prune juice may be helpful) and high fiber foods °Colace 100 mg by mouth twice a day  °Senokot for constipation as directed and as needed Dulcolax (bisacodyl), take with full glass of water  °Miralax (polyethylene glycol) once or twice a day as needed. ° °If you have tried all these things and are unable to have a bowel movement in the first 3-4 days after surgery call either your surgeon or your primary doctor.   ° °If you experience loose stools or diarrhea, hold the medications until you stool forms back up.  If your symptoms do not get better within 1 week or if they get worse, check with your doctor.  If you experience "the worst abdominal pain ever" or develop nausea or vomiting, please contact the office immediately for further recommendations for treatment. ° ° °ITCHING:  If you experience itching with your medications, try taking only a single pain pill, or even half a pain pill at a time.  You can also use Benadryl over the counter for itching or also to help with sleep.  ° °TED HOSE STOCKINGS:  Use stockings on both legs until for at least 2 weeks or as directed by physician office. They may be removed at night for sleeping. ° °MEDICATIONS:  See your medication summary on the “After Visit Summary” that nursing will review with you.  You may have some home medications which will be placed on hold until you complete the course of blood thinner medication.  It is important for you to complete the blood thinner medication as prescribed. ° °PRECAUTIONS:  If you experience chest pain or shortness of breath - call 911 immediately for transfer to the hospital emergency department.  ° °If you develop a fever greater that 101 F, purulent drainage from wound, increased redness or drainage from wound, foul odor from the wound/dressing, or calf pain - CONTACT YOUR SURGEON.   °                                                °FOLLOW-UP APPOINTMENTS:  If  you do not already have a post-op appointment, please call the office for an appointment to be seen by your surgeon.  Guidelines for how   soon to be seen are listed in your “After Visit Summary”, but are typically between 1-4 weeks after surgery. ° °OTHER INSTRUCTIONS:  ° °Knee Replacement:  Do not place pillow under knee, focus on keeping the knee straight while resting. CPM instructions: 0-90 degrees, 2 hours in the morning, 2 hours in the afternoon, and 2 hours in the evening. Place foam block, curve side up under heel at all times except when in CPM or when walking.  DO NOT modify, tear, cut, or change the foam block in any way. ° °MAKE SURE YOU:  °• Understand these instructions.  °• Get help right away if you are not doing well or get worse.  ° ° °Thank you for letting us be a part of your medical care team.  It is a privilege we respect greatly.  We hope these instructions will help you stay on track for a fast and full recovery!  ° ° °

## 2015-10-10 NOTE — Clinical Documentation Improvement (Signed)
Internal Medicine Orthopedic  Can the diagnosis of anemia be further specified? Please document findings in next progress note. Thank you!   Iron deficiency Anemia  Acute Blood Loss Anemia  Nutritional anemia, including the nutrition or mineral deficits  Chronic Blood Loss Anemia, including the suspected or known cause  Anemia of chronic disease, including the associated chronic disease state  Other  Clinically Undetermined  Document any associated diagnoses/conditions.  Supporting Information:  H&H has dropped from 11.2 to 8.5 while in-house; serially being monitored  Please exercise your independent, professional judgment when responding. A specific answer is not anticipated or expected.  Thank You,  Zoila Shutter RN, BSN, [place] [phone]; Cell: [phone]

## 2015-10-11 DIAGNOSIS — Z9660 Presence of unspecified orthopedic joint implant: Secondary | ICD-10-CM | POA: Diagnosis not present

## 2015-10-11 DIAGNOSIS — E440 Moderate protein-calorie malnutrition: Secondary | ICD-10-CM | POA: Diagnosis not present

## 2015-10-11 DIAGNOSIS — R410 Disorientation, unspecified: Secondary | ICD-10-CM | POA: Diagnosis not present

## 2015-10-11 DIAGNOSIS — W19XXXD Unspecified fall, subsequent encounter: Secondary | ICD-10-CM | POA: Diagnosis not present

## 2015-10-11 DIAGNOSIS — F419 Anxiety disorder, unspecified: Secondary | ICD-10-CM | POA: Diagnosis not present

## 2015-10-11 DIAGNOSIS — R300 Dysuria: Secondary | ICD-10-CM | POA: Diagnosis not present

## 2015-10-11 DIAGNOSIS — R41841 Cognitive communication deficit: Secondary | ICD-10-CM | POA: Diagnosis not present

## 2015-10-11 DIAGNOSIS — S72009A Fracture of unspecified part of neck of unspecified femur, initial encounter for closed fracture: Secondary | ICD-10-CM | POA: Diagnosis not present

## 2015-10-11 DIAGNOSIS — Z471 Aftercare following joint replacement surgery: Secondary | ICD-10-CM | POA: Diagnosis not present

## 2015-10-11 DIAGNOSIS — H109 Unspecified conjunctivitis: Secondary | ICD-10-CM | POA: Diagnosis not present

## 2015-10-11 DIAGNOSIS — F329 Major depressive disorder, single episode, unspecified: Secondary | ICD-10-CM | POA: Diagnosis not present

## 2015-10-11 DIAGNOSIS — D649 Anemia, unspecified: Secondary | ICD-10-CM | POA: Diagnosis not present

## 2015-10-11 DIAGNOSIS — D62 Acute posthemorrhagic anemia: Secondary | ICD-10-CM | POA: Diagnosis not present

## 2015-10-11 DIAGNOSIS — R278 Other lack of coordination: Secondary | ICD-10-CM | POA: Diagnosis not present

## 2015-10-11 DIAGNOSIS — S72001A Fracture of unspecified part of neck of right femur, initial encounter for closed fracture: Secondary | ICD-10-CM | POA: Diagnosis not present

## 2015-10-11 DIAGNOSIS — I951 Orthostatic hypotension: Secondary | ICD-10-CM | POA: Diagnosis not present

## 2015-10-11 DIAGNOSIS — E46 Unspecified protein-calorie malnutrition: Secondary | ICD-10-CM | POA: Diagnosis not present

## 2015-10-11 DIAGNOSIS — F411 Generalized anxiety disorder: Secondary | ICD-10-CM | POA: Diagnosis not present

## 2015-10-11 DIAGNOSIS — I10 Essential (primary) hypertension: Secondary | ICD-10-CM | POA: Diagnosis not present

## 2015-10-11 DIAGNOSIS — I9589 Other hypotension: Secondary | ICD-10-CM | POA: Diagnosis not present

## 2015-10-11 DIAGNOSIS — R1312 Dysphagia, oropharyngeal phase: Secondary | ICD-10-CM | POA: Diagnosis not present

## 2015-10-11 DIAGNOSIS — D508 Other iron deficiency anemias: Secondary | ICD-10-CM | POA: Diagnosis not present

## 2015-10-11 DIAGNOSIS — M8580 Other specified disorders of bone density and structure, unspecified site: Secondary | ICD-10-CM | POA: Diagnosis not present

## 2015-10-11 DIAGNOSIS — R1310 Dysphagia, unspecified: Secondary | ICD-10-CM | POA: Diagnosis not present

## 2015-10-11 DIAGNOSIS — S72001S Fracture of unspecified part of neck of right femur, sequela: Secondary | ICD-10-CM | POA: Diagnosis not present

## 2015-10-11 DIAGNOSIS — R262 Difficulty in walking, not elsewhere classified: Secondary | ICD-10-CM | POA: Diagnosis not present

## 2015-10-11 DIAGNOSIS — Z96641 Presence of right artificial hip joint: Secondary | ICD-10-CM | POA: Diagnosis not present

## 2015-10-11 DIAGNOSIS — M170 Bilateral primary osteoarthritis of knee: Secondary | ICD-10-CM | POA: Diagnosis not present

## 2015-10-11 DIAGNOSIS — M6281 Muscle weakness (generalized): Secondary | ICD-10-CM | POA: Diagnosis not present

## 2015-10-11 DIAGNOSIS — R269 Unspecified abnormalities of gait and mobility: Secondary | ICD-10-CM | POA: Diagnosis not present

## 2015-10-11 DIAGNOSIS — Z9181 History of falling: Secondary | ICD-10-CM | POA: Diagnosis not present

## 2015-10-11 DIAGNOSIS — R5381 Other malaise: Secondary | ICD-10-CM | POA: Diagnosis not present

## 2015-10-11 LAB — CBC
HCT: 27.2 % — ABNORMAL LOW (ref 36.0–46.0)
Hemoglobin: 8.5 g/dL — ABNORMAL LOW (ref 12.0–15.0)
MCH: 29.7 pg (ref 26.0–34.0)
MCHC: 31.3 g/dL (ref 30.0–36.0)
MCV: 95.1 fL (ref 78.0–100.0)
PLATELETS: 207 10*3/uL (ref 150–400)
RBC: 2.86 MIL/uL — ABNORMAL LOW (ref 3.87–5.11)
RDW: 14.5 % (ref 11.5–15.5)
WBC: 8.5 10*3/uL (ref 4.0–10.5)

## 2015-10-11 LAB — BASIC METABOLIC PANEL
Anion gap: 5 (ref 5–15)
BUN: 12 mg/dL (ref 4–21)
BUN: 12 mg/dL (ref 6–20)
CALCIUM: 7.8 mg/dL — AB (ref 8.9–10.3)
CO2: 27 mmol/L (ref 22–32)
CREATININE: 0.67 mg/dL (ref 0.44–1.00)
Chloride: 107 mmol/L (ref 101–111)
Creatinine: 0.7 mg/dL (ref 0.5–1.1)
GFR calc Af Amer: >60 mL/min (ref >60)
GLUCOSE: 100 mg/dL — AB (ref 65–99)
Glucose: 100 mg/dL
POTASSIUM: 4.1 mmol/L (ref 3.4–5.3)
Potassium: 4.1 mmol/L (ref 3.5–5.1)
Sodium: 139 mmol/L (ref 135–145)
Sodium: 139 mmol/L (ref 137–147)

## 2015-10-11 LAB — CBC AND DIFFERENTIAL
HCT: 27 % — AB (ref 36–46)
HEMOGLOBIN: 8.5 g/dL — AB (ref 12.0–16.0)
Platelets: 207 10*3/uL (ref 150–399)
WBC: 8.5 10*3/mL

## 2015-10-11 MED ORDER — RIVAROXABAN 10 MG PO TABS: 10.0000 mg | ORAL_TABLET | Freq: Every day | ORAL | Status: AC

## 2015-10-11 MED ORDER — HYDROCODONE-ACETAMINOPHEN 5-325 MG PO TABS: 1.0000 | ORAL_TABLET | Freq: Four times a day (QID) | ORAL | Status: DC | PRN

## 2015-10-11 NOTE — Clinical Social Work Note (Signed)
CSW met with patient and husband at bedside to review bed offers. They chose [facility] SNF who is able to offer private room/bath and is in close proximity to their home.  They also mentioned a pastor friend who received STR there in the past.  Wife exhibited high anxiety regarding STR and wishes she could go home instead.  Patient is aware of what the best thing is, but states she doesn't know anyone there and is afraid her hair doesn't look good.  CSW redirected patient to focus on rehab, gaining strength to return home and independence with ambulations.  Patient is agreeable to SNF.    Liaison will complete paperwork at bedside at 3pm.  Insurance authorization has been initiated.    Patient will discharge today per MD order. Patient will discharge to: Isaias Cowman RN to call report prior to transportation to: [phone] Transportation: [facility]- to be called after 3pm  CSW sent discharge summary to SNF for review.  Packet is complete.  RN, patient and family aware of discharge plans.  Nonnie Done, LCSW [phone]  5N1-9, 2S 15-16 and Psychiatric Service Line  Licensed Clinical Social Worker
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
## 2015-10-11 NOTE — NC FL2 (Signed)
[place] LEVEL OF CARE SCREENING TOOL     IDENTIFICATION  Patient Name: Kelsey Hebert Birthdate: June 13, 1936 Sex: female Admission Date (Current Location): 10/08/2015  [facility] and [place] Number:  Herbalist and Address:  [facility], [place] [address], [place], [place] [address]      Provider Number: [id number]  Attending Physician Name and Address:  Verlee Monte, MD  Relative Name and Phone Number:   Sheyann Alongi [phone]))    Current Level of Care: Hospital Recommended Level of Care: [place] Prior Approval Number:    Date Approved/Denied:   PASRR Number:    Discharge Plan: SNF    Current Diagnoses: Patient Active Problem List   Diagnosis Date Noted  . Malnutrition of moderate degree 10/10/2015  . Hip fracture ([place]) 10/09/2015  . Closed right hip fracture ([place]) 10/08/2015  . Hypokalemia 10/08/2015  . Conjunctivitis 10/08/2015  . Normocytic anemia 10/08/2015  . Fall   . Osteoarthritis of both knees 11/10/2014  . Hyperglycemia 03/03/2014  . Obesity 03/03/2014  . Osteopenia   . SEPTICEMIA NOS 07/23/2006  . THROMBOSIS, PORTAL VEIN 07/23/2006  . ABDOMINAL WALL HERNIA 07/23/2006  . ABSCESS, INTESTINE 07/23/2006  . Disorder of bone and cartilage 07/23/2006  . Essential hypertension 06/04/2006    Orientation RESPIRATION BLADDER Height & Weight     Self, Time, Situation, Place  Normal Continent Weight: 137 lb 12.6 oz (62.5 kg) Height:     BEHAVIORAL SYMPTOMS/MOOD NEUROLOGICAL BOWEL NUTRITION STATUS     (no focal weakness, numbness, or tingling, no vision change or hearing loss) Continent    AMBULATORY STATUS COMMUNICATION OF NEEDS Skin   Limited Assist Verbally                         Personal Care Assistance Level of Assistance  Bathing, Feeding, Dressing Bathing Assistance: Limited assistance Feeding assistance: Limited assistance Dressing Assistance: Limited assistance      Functional Limitations Info  Sight, Hearing, Speech Sight Info: Adequate Hearing Info: Adequate Speech Info: Adequate    SPECIAL CARE FACTORS FREQUENCY  PT (By licensed PT), OT (By licensed OT)     PT Frequency: Min 3X/week OT Frequency: Min 2X/week            Contractures      Additional Factors Info  Code Status, Allergies Code Status Info: full Allergies Info: tramadol           Current Medications (10/11/2015):  This is the current hospital active medication list Current Facility-Administered Medications  Medication Dose Route Frequency Provider Last Rate Last Dose  . acetaminophen (TYLENOL) tablet 650 mg  650 mg Oral Q6H PRN Meredith Pel, MD       Or  . acetaminophen (TYLENOL) suppository 650 mg  650 mg Rectal Q6H PRN Meredith Pel, MD      . bisacodyl (DULCOLAX) EC tablet 5 mg  5 mg Oral Daily PRN Vianne Bulls, MD      . docusate sodium (COLACE) capsule 100 mg  100 mg Oral BID Meredith Pel, MD   100 mg at 10/10/15 2142  . feeding supplement (ENSURE ENLIVE) (ENSURE ENLIVE) liquid 237 mL  237 mL Oral Q1500 Verlee Monte, MD   237 mL at 10/10/15 1500  . hydrALAZINE (APRESOLINE) injection 10 mg  10 mg Intravenous Q4H PRN Vianne Bulls, MD      . HYDROcodone-acetaminophen (NORCO/VICODIN) 5-325 MG per tablet  1-2 tablet  1-2 tablet Oral Q6H PRN Vianne Bulls, MD   2 tablet at 10/11/15 [phone]  . menthol-cetylpyridinium (CEPACOL) lozenge 3 mg  1 lozenge Oral PRN Meredith Pel, MD       Or  . phenol (CHLORASEPTIC) mouth spray 1 spray  1 spray Mouth/Throat PRN Meredith Pel, MD      . methocarbamol (ROBAXIN) tablet 500 mg  500 mg Oral Q6H PRN Vianne Bulls, MD   500 mg at 10/10/15 0459   Or  . methocarbamol (ROBAXIN) 500 mg in dextrose 5 % 50 mL IVPB  500 mg Intravenous Q6H PRN Vianne Bulls, MD      . metoCLOPramide (REGLAN) tablet 5-10 mg  5-10 mg Oral Q8H PRN Meredith Pel, MD       Or  . metoCLOPramide (REGLAN) injection 5-10 mg   5-10 mg Intravenous Q8H PRN Meredith Pel, MD      . morphine 2 MG/ML injection 0.5 mg  0.5 mg Intravenous Q2H PRN Vianne Bulls, MD   0.5 mg at 10/10/15 0606  . ondansetron (ZOFRAN) tablet 4 mg  4 mg Oral Q6H PRN Meredith Pel, MD       Or  . ondansetron [facility]) injection 4 mg  4 mg Intravenous Q6H PRN Meredith Pel, MD   4 mg at 10/10/15 0815  . polyethylene glycol (MIRALAX / GLYCOLAX) packet 17 g  17 g Oral Daily PRN Vianne Bulls, MD      . rivaroxaban (XARELTO) tablet 10 mg  10 mg Oral Q breakfast Meredith Pel, MD   10 mg at 10/11/15 0755  . trimethoprim-polymyxin b (POLYTRIM) ophthalmic solution 1 drop  1 drop Both Eyes 6 times per day Vianne Bulls, MD   1 drop at 10/11/15 0800     Discharge Medications: Please see discharge summary for a list of discharge medications.  Relevant Imaging Results:  Relevant Lab Results:   Additional Information [phone]  [place] intern  [phone]

## 2015-10-11 NOTE — Clinical Social Work Note (Signed)
CSW received PASARR [id number]  Nonnie Done, LCSW [phone]  5N1-9; 2S 15-16 and [place] Licensed Clinical Social Worker

## 2015-10-11 NOTE — Progress Notes (Signed)
Pt stable Mobilizing in room Leg lengths ok Dc today with f/u 2 weeks
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
## 2015-10-11 NOTE — Progress Notes (Signed)
Called report to [place] at [facility] [phone].

## 2015-10-11 NOTE — Care Management Important Message (Signed)
Important Message  Patient Details  Name: Kelsey Hebert MRN: [id number] Date of Birth: [birth year]   Medicare Important Message Given:  Yes    Ehsan Corvin P [place] 10/11/2015, 12:02 PM

## 2015-10-11 NOTE — Clinical Social Work Placement (Signed)
   CLINICAL SOCIAL WORK PLACEMENT  NOTE  Date:  10/11/2015  Patient Details  Name: SHERREN LEMOS MRN: [id number] Date of Birth: 12-26-1935  Clinical Social Work is seeking post-discharge placement for this patient at the [place] level of care (*CSW will initial, date and re-position this form in  chart as items are completed):  Yes   Patient/family provided with [facility] Work Department's list of facilities offering this level of care within the geographic area requested by the patient (or if unable, by the patient's family).  Yes   Patient/family informed of their freedom to choose among providers that offer the needed level of care, that participate in Medicare, Medicaid or managed care program needed by the patient, have an available bed and are willing to accept the patient.  Yes   Patient/family informed of [facility]'s ownership interest in [facility] and [facility], as well as of the fact that they are under no obligation to receive care at these facilities.  PASRR submitted to EDS on 10/11/15     PASRR number received on 10/11/15     Existing PASRR number confirmed on       FL2 transmitted to all facilities in geographic area requested by pt/family on 10/11/15     FL2 transmitted to all facilities within larger geographic area on       Patient informed that his/her managed care company has contracts with or will negotiate with certain facilities, including the following:        Yes   Patient/family informed of bed offers received.  Patient chooses bed at       Physician recommends and patient chooses bed at      Patient to be transferred to   on  .  Patient to be transferred to facility by       Patient family notified on   of transfer.  Name of family member notified:        PHYSICIAN Please prepare priority discharge summary, including medications     Additional Comment:     _______________________________________________ Dulcy Fanny, LCSW 10/11/2015, 10:00 AM
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
## 2015-10-12 ENCOUNTER — Other Ambulatory Visit: Payer: Self-pay

## 2015-10-12 ENCOUNTER — Non-Acute Institutional Stay (SKILLED_NURSING_FACILITY): Payer: Commercial Managed Care - HMO | Admitting: Internal Medicine

## 2015-10-12 ENCOUNTER — Encounter: Payer: Self-pay | Admitting: Internal Medicine

## 2015-10-12 DIAGNOSIS — R5381 Other malaise: Secondary | ICD-10-CM | POA: Diagnosis not present

## 2015-10-12 DIAGNOSIS — S72001S Fracture of unspecified part of neck of right femur, sequela: Secondary | ICD-10-CM

## 2015-10-12 DIAGNOSIS — R1310 Dysphagia, unspecified: Secondary | ICD-10-CM | POA: Diagnosis not present

## 2015-10-12 DIAGNOSIS — F411 Generalized anxiety disorder: Secondary | ICD-10-CM

## 2015-10-12 DIAGNOSIS — D62 Acute posthemorrhagic anemia: Secondary | ICD-10-CM

## 2015-10-12 DIAGNOSIS — I951 Orthostatic hypotension: Secondary | ICD-10-CM | POA: Diagnosis not present

## 2015-10-12 DIAGNOSIS — E46 Unspecified protein-calorie malnutrition: Secondary | ICD-10-CM | POA: Diagnosis not present

## 2015-10-12 MED ORDER — HYDROCODONE-ACETAMINOPHEN 5-325 MG PO TABS: 1.0000 | ORAL_TABLET | Freq: Four times a day (QID) | ORAL | Status: DC | PRN

## 2015-10-12 NOTE — Telephone Encounter (Signed)
[facility]  [address] [place] [address]  Phone: [phone]  Fax: [phone]  
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
## 2015-10-14 ENCOUNTER — Telehealth: Payer: Self-pay | Admitting: *Deleted

## 2015-10-14 NOTE — Telephone Encounter (Signed)
Received fax from [facility] on 10/12/15 from hospital  Date of admit:10/08/15  Date of discharge: 10/11/15  Level of Care: Inpatient Acute Medical Care  Discharging facility: [facility]  Attending physician: Phillis Knack  PCP: Flonnie Hailstone  Discharge Dispostion: discharged/transferred to SNF with Medicare Certification  DX: [id number] FX of unsp part of neck of rt femur,init

## 2015-10-17 ENCOUNTER — Non-Acute Institutional Stay: Payer: Commercial Managed Care - HMO | Admitting: Family

## 2015-10-17 DIAGNOSIS — R410 Disorientation, unspecified: Secondary | ICD-10-CM | POA: Diagnosis not present

## 2015-10-17 DIAGNOSIS — F411 Generalized anxiety disorder: Secondary | ICD-10-CM | POA: Diagnosis not present

## 2015-10-17 DIAGNOSIS — I9589 Other hypotension: Secondary | ICD-10-CM | POA: Diagnosis not present
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
## 2015-10-24 ENCOUNTER — Non-Acute Institutional Stay (SKILLED_NURSING_FACILITY): Payer: Commercial Managed Care - HMO | Admitting: Family

## 2015-10-24 DIAGNOSIS — F32A Depression, unspecified: Secondary | ICD-10-CM

## 2015-10-24 DIAGNOSIS — F411 Generalized anxiety disorder: Secondary | ICD-10-CM | POA: Diagnosis not present

## 2015-10-24 DIAGNOSIS — I10 Essential (primary) hypertension: Secondary | ICD-10-CM

## 2015-10-24 DIAGNOSIS — E440 Moderate protein-calorie malnutrition: Secondary | ICD-10-CM | POA: Diagnosis not present

## 2015-10-24 DIAGNOSIS — M170 Bilateral primary osteoarthritis of knee: Secondary | ICD-10-CM

## 2015-10-24 DIAGNOSIS — F329 Major depressive disorder, single episode, unspecified: Secondary | ICD-10-CM | POA: Diagnosis not present

## 2015-10-24 DIAGNOSIS — D649 Anemia, unspecified: Secondary | ICD-10-CM

## 2015-10-24 DIAGNOSIS — Z9660 Presence of unspecified orthopedic joint implant: Secondary | ICD-10-CM | POA: Diagnosis not present

## 2015-10-24 DIAGNOSIS — M8580 Other specified disorders of bone density and structure, unspecified site: Secondary | ICD-10-CM

## 2015-10-24 DIAGNOSIS — R269 Unspecified abnormalities of gait and mobility: Secondary | ICD-10-CM

## 2015-10-24 DIAGNOSIS — Z96649 Presence of unspecified artificial hip joint: Secondary | ICD-10-CM
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
## 2015-10-26 DIAGNOSIS — S72001D Fracture of unspecified part of neck of right femur, subsequent encounter for closed fracture with routine healing: Secondary | ICD-10-CM | POA: Diagnosis not present

## 2015-11-05 ENCOUNTER — Other Ambulatory Visit: Payer: Self-pay | Admitting: Physician Assistant

## 2015-11-07 NOTE — Telephone Encounter (Signed)
Lisinopril refill denied.  Recently discontinued when pt at skilled facility.  Has appt here 4/19.

## 2015-11-09 ENCOUNTER — Inpatient Hospital Stay: Payer: Commercial Managed Care - HMO | Admitting: Physician Assistant

## 2015-11-09 ENCOUNTER — Telehealth: Payer: Self-pay | Admitting: Physician Assistant

## 2015-11-09 NOTE — Telephone Encounter (Signed)
Pt had hosp/rehab appt today which she called and canceled because she didn't feel like coming.  Called pt back got voice mail, left message that she needs to be seen and to call and reschedule her appt and have questions answered at that time.  Has not been in the office since January.  Did not follow up from that appt with GI as instructed.  Has since fallen and has had fracture hip repair.  Needs to been seen to effectively address concerns.

## 2015-11-09 NOTE — Telephone Encounter (Signed)
Agree. Had OV scheduled here for today and was still on schedule this morning but then cancelled. Needs OV.

## 2015-11-09 NOTE — Telephone Encounter (Signed)
Pt has some questions about her blood pressure and weight loss. Please call [phone]

## 2015-12-05 ENCOUNTER — Emergency Department (HOSPITAL_COMMUNITY): Payer: Commercial Managed Care - HMO

## 2015-12-05 ENCOUNTER — Emergency Department (HOSPITAL_COMMUNITY)
Admission: EM | Admit: 2015-12-05 | Discharge: 2015-12-05 | Disposition: A | Discharge status: Home / Self Care | Payer: Commercial Managed Care - HMO | Attending: Emergency Medicine | Admitting: Emergency Medicine

## 2015-12-05 ENCOUNTER — Encounter (HOSPITAL_COMMUNITY): Payer: Self-pay | Admitting: Emergency Medicine

## 2015-12-05 DIAGNOSIS — R634 Abnormal weight loss: Secondary | ICD-10-CM | POA: Diagnosis not present

## 2015-12-05 DIAGNOSIS — R531 Weakness: Secondary | ICD-10-CM | POA: Diagnosis not present

## 2015-12-05 DIAGNOSIS — Z79899 Other long term (current) drug therapy: Secondary | ICD-10-CM | POA: Diagnosis not present

## 2015-12-05 DIAGNOSIS — Z96641 Presence of right artificial hip joint: Secondary | ICD-10-CM | POA: Insufficient documentation

## 2015-12-05 DIAGNOSIS — I10 Essential (primary) hypertension: Secondary | ICD-10-CM | POA: Insufficient documentation

## 2015-12-05 DIAGNOSIS — R630 Anorexia: Secondary | ICD-10-CM | POA: Insufficient documentation

## 2015-12-05 DIAGNOSIS — R627 Adult failure to thrive: Secondary | ICD-10-CM | POA: Diagnosis present

## 2015-12-05 DIAGNOSIS — Z7901 Long term (current) use of anticoagulants: Secondary | ICD-10-CM | POA: Insufficient documentation

## 2015-12-05 DIAGNOSIS — R404 Transient alteration of awareness: Secondary | ICD-10-CM | POA: Diagnosis not present

## 2015-12-05 DIAGNOSIS — M170 Bilateral primary osteoarthritis of knee: Secondary | ICD-10-CM | POA: Insufficient documentation

## 2015-12-05 DIAGNOSIS — Z79891 Long term (current) use of opiate analgesic: Secondary | ICD-10-CM | POA: Insufficient documentation

## 2015-12-05 DIAGNOSIS — M8580 Other specified disorders of bone density and structure, unspecified site: Secondary | ICD-10-CM | POA: Diagnosis not present

## 2015-12-05 DIAGNOSIS — R109 Unspecified abdominal pain: Secondary | ICD-10-CM | POA: Diagnosis not present

## 2015-12-05 LAB — CBC WITH DIFFERENTIAL/PLATELET
Basophils Absolute: 0 10*3/uL (ref 0.0–0.1)
Basophils Relative: 1 %
EOS ABS: 0.1 10*3/uL (ref 0.0–0.7)
EOS PCT: 3 %
HCT: 39 % (ref 36.0–46.0)
Hemoglobin: 12.7 g/dL (ref 12.0–15.0)
LYMPHS ABS: 1.3 10*3/uL (ref 0.7–4.0)
Lymphocytes Relative: 26 %
MCH: 30.9 pg (ref 26.0–34.0)
MCHC: 32.6 g/dL (ref 30.0–36.0)
MCV: 94.9 fL (ref 78.0–100.0)
MONOS PCT: 8 %
Monocytes Absolute: 0.4 10*3/uL (ref 0.1–1.0)
Neutro Abs: 3 10*3/uL (ref 1.7–7.7)
Neutrophils Relative %: 62 %
PLATELETS: 279 10*3/uL (ref 150–400)
RBC: 4.11 MIL/uL (ref 3.87–5.11)
RDW: 13.8 % (ref 11.5–15.5)
WBC: 4.8 10*3/uL (ref 4.0–10.5)

## 2015-12-05 LAB — COMPREHENSIVE METABOLIC PANEL
ALK PHOS: 67 U/L (ref 38–126)
ALT: 78 U/L — ABNORMAL HIGH (ref 14–54)
ANION GAP: 6 (ref 5–15)
AST: 84 U/L — ABNORMAL HIGH (ref 15–41)
Albumin: 3.7 g/dL (ref 3.5–5.0)
BUN: 13 mg/dL (ref 6–20)
CALCIUM: 8.7 mg/dL — AB (ref 8.9–10.3)
CHLORIDE: 102 mmol/L (ref 101–111)
CO2: 29 mmol/L (ref 22–32)
Creatinine, Ser: 0.73 mg/dL (ref 0.44–1.00)
GFR calc non Af Amer: >60 mL/min (ref >60)
Glucose, Bld: 84 mg/dL (ref 65–99)
Potassium: 3.4 mmol/L — ABNORMAL LOW (ref 3.5–5.1)
SODIUM: 137 mmol/L (ref 135–145)
Total Bilirubin: 0.7 mg/dL (ref 0.3–1.2)
Total Protein: 6.1 g/dL — ABNORMAL LOW (ref 6.5–8.1)

## 2015-12-05 LAB — LIPASE, BLOOD: LIPASE: 37 U/L (ref 11–51)

## 2015-12-05 LAB — URINALYSIS, ROUTINE W REFLEX MICROSCOPIC
Bilirubin Urine: NEGATIVE
Glucose, UA: NEGATIVE mg/dL
Hgb urine dipstick: NEGATIVE
KETONES UR: NEGATIVE mg/dL
NITRITE: NEGATIVE
PH: 5.5 (ref 5.0–8.0)
Protein, ur: NEGATIVE mg/dL
Specific Gravity, Urine: 1.004 — ABNORMAL LOW (ref 1.005–1.030)

## 2015-12-05 LAB — URINE MICROSCOPIC-ADD ON

## 2015-12-05 LAB — CBG MONITORING, ED: GLUCOSE-CAPILLARY: 70 mg/dL (ref 65–99)

## 2015-12-05 MED ORDER — ONDANSETRON 4 MG PO TBDP: ORAL_TABLET | ORAL | Status: DC

## 2015-12-05 MED ORDER — IOPAMIDOL (ISOVUE-300) INJECTION 61%
100.0000 mL | Freq: Once | INTRAVENOUS | Status: AC | PRN
  Administered 2015-12-05: 80 mL via INTRAVENOUS

## 2015-12-05 MED ORDER — SODIUM CHLORIDE 0.9 % IV BOLUS (SEPSIS)
1000.0000 mL | Freq: Once | INTRAVENOUS | Status: AC
  Administered 2015-12-05: 1000 mL via INTRAVENOUS

## 2015-12-05 MED ORDER — DIATRIZOATE MEGLUMINE & SODIUM 66-10 % PO SOLN
15.0000 mL | Freq: Once | ORAL | Status: AC
  Administered 2015-12-05: 15 mL via ORAL

## 2015-12-05 NOTE — ED Notes (Addendum)
Per EMS family reports anorexia for 6 months; pt reports because dentures are messed up.

## 2015-12-05 NOTE — ED Notes (Signed)
Bed: WA04 Expected date:  Expected time:  Means of arrival:  Comments: EMS 

## 2015-12-05 NOTE — Discharge Instructions (Signed)
Take zofran as needed if you are nauseated.   Increase ensure plus to three times daily.   You may eat some sweets or ice cream or cake to help you gain weight.   Check weight with your doctor in a week.   Stay hydrated, drink plenty of fluids.   Return to ER if you have vomiting, abdominal pain, fevers, dehydration.
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
## 2015-12-05 NOTE — ED Notes (Signed)
RN starting IV, drawing labs 

## 2015-12-22 ENCOUNTER — Ambulatory Visit (INDEPENDENT_AMBULATORY_CARE_PROVIDER_SITE_OTHER): Payer: Commercial Managed Care - HMO | Admitting: Physician Assistant

## 2015-12-22 VITALS — BP 112/70 | HR 60 | Temp 97.6°F | Resp 18 | Wt 118.0 lb

## 2015-12-22 DIAGNOSIS — R634 Abnormal weight loss: Secondary | ICD-10-CM | POA: Diagnosis not present

## 2015-12-22 DIAGNOSIS — E440 Moderate protein-calorie malnutrition: Secondary | ICD-10-CM
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
## 2015-12-23 ENCOUNTER — Telehealth: Payer: Self-pay | Admitting: Internal Medicine

## 2015-12-23 NOTE — Telephone Encounter (Signed)
6/2  Doc Of Day   Referral for patient with Malnutrition of moderate degree and Unintentional weight loss.  Patient states she has lost so much weight she can no longer wear her denture, thus making it even harder to eat foods.  Due to age, please advise.

## 2015-12-23 NOTE — Telephone Encounter (Signed)
Pt scheduled to see Nicoletta Ba PA 12/28/15@10am , please notify pt of appt.

## 2015-12-28 ENCOUNTER — Telehealth: Payer: Self-pay | Admitting: Physician Assistant

## 2015-12-28 ENCOUNTER — Ambulatory Visit: Payer: Commercial Managed Care - HMO | Admitting: Physician Assistant

## 2015-12-28 NOTE — Telephone Encounter (Signed)
Received fax from [place] back Auth# [id number]  Treating Provider Dr. Hilarie Fredrickson # of Visits 6 Start Date 12/28/2015 End Date 06/25/2016  CPT 99499 DX E44.0, R63.4

## 2016-01-20 ENCOUNTER — Ambulatory Visit: Payer: Commercial Managed Care - HMO | Admitting: Physician Assistant

## 2016-03-07 ENCOUNTER — Ambulatory Visit: Payer: Commercial Managed Care - HMO | Admitting: Internal Medicine

## 2016-03-22 ENCOUNTER — Encounter: Payer: Self-pay | Admitting: *Deleted

## 2016-03-30 ENCOUNTER — Ambulatory Visit: Payer: Commercial Managed Care - HMO | Admitting: Internal Medicine

## 2016-06-29 ENCOUNTER — Ambulatory Visit: Payer: Commercial Managed Care - HMO | Admitting: Internal Medicine

## 2017-07-18 ENCOUNTER — Encounter (HOSPITAL_COMMUNITY): Payer: Self-pay | Admitting: Nurse Practitioner

## 2017-07-18 ENCOUNTER — Emergency Department (HOSPITAL_COMMUNITY): Payer: Medicare HMO

## 2017-07-18 ENCOUNTER — Other Ambulatory Visit: Payer: Self-pay

## 2017-07-18 ENCOUNTER — Emergency Department (HOSPITAL_COMMUNITY)
Admission: EM | Admit: 2017-07-18 | Discharge: 2017-07-18 | Disposition: A | Discharge status: Home / Self Care | Payer: Medicare HMO | Attending: Emergency Medicine | Admitting: Emergency Medicine

## 2017-07-18 DIAGNOSIS — R627 Adult failure to thrive: Secondary | ICD-10-CM | POA: Diagnosis not present

## 2017-07-18 DIAGNOSIS — Z79899 Other long term (current) drug therapy: Secondary | ICD-10-CM | POA: Insufficient documentation

## 2017-07-18 DIAGNOSIS — Z7901 Long term (current) use of anticoagulants: Secondary | ICD-10-CM | POA: Diagnosis not present

## 2017-07-18 DIAGNOSIS — R634 Abnormal weight loss: Secondary | ICD-10-CM | POA: Insufficient documentation

## 2017-07-18 DIAGNOSIS — I10 Essential (primary) hypertension: Secondary | ICD-10-CM | POA: Insufficient documentation

## 2017-07-18 DIAGNOSIS — E860 Dehydration: Secondary | ICD-10-CM | POA: Diagnosis not present

## 2017-07-18 DIAGNOSIS — R0902 Hypoxemia: Secondary | ICD-10-CM | POA: Diagnosis not present

## 2017-07-18 DIAGNOSIS — R630 Anorexia: Secondary | ICD-10-CM | POA: Diagnosis not present

## 2017-07-18 DIAGNOSIS — K8020 Calculus of gallbladder without cholecystitis without obstruction: Secondary | ICD-10-CM | POA: Diagnosis not present

## 2017-07-18 DIAGNOSIS — R531 Weakness: Secondary | ICD-10-CM | POA: Diagnosis present

## 2017-07-18 LAB — URINALYSIS, ROUTINE W REFLEX MICROSCOPIC
Bilirubin Urine: NEGATIVE
GLUCOSE, UA: NEGATIVE mg/dL
HGB URINE DIPSTICK: NEGATIVE
Ketones, ur: NEGATIVE mg/dL
LEUKOCYTES UA: NEGATIVE
NITRITE: POSITIVE — AB
PH: 7 (ref 5.0–8.0)
Protein, ur: NEGATIVE mg/dL
SPECIFIC GRAVITY, URINE: 1.029 (ref 1.005–1.030)

## 2017-07-18 LAB — COMPREHENSIVE METABOLIC PANEL
ALBUMIN: 3.2 g/dL — AB (ref 3.5–5.0)
ALT: 136 U/L — ABNORMAL HIGH (ref 14–54)
ANION GAP: 6 (ref 5–15)
AST: 125 U/L — ABNORMAL HIGH (ref 15–41)
Alkaline Phosphatase: 144 U/L — ABNORMAL HIGH (ref 38–126)
BILIRUBIN TOTAL: 0.6 mg/dL (ref 0.3–1.2)
BUN: 49 mg/dL — ABNORMAL HIGH (ref 6–20)
CO2: 30 mmol/L (ref 22–32)
Calcium: 8.8 mg/dL — ABNORMAL LOW (ref 8.9–10.3)
Chloride: 112 mmol/L — ABNORMAL HIGH (ref 101–111)
Creatinine, Ser: 0.77 mg/dL (ref 0.44–1.00)
GFR calc Af Amer: >60 mL/min (ref >60)
GFR calc non Af Amer: >60 mL/min (ref >60)
GLUCOSE: 107 mg/dL — AB (ref 65–99)
POTASSIUM: 4.4 mmol/L (ref 3.5–5.1)
SODIUM: 148 mmol/L — AB (ref 135–145)
TOTAL PROTEIN: 6.5 g/dL (ref 6.5–8.1)

## 2017-07-18 LAB — CBC WITH DIFFERENTIAL/PLATELET
BASOS PCT: 0 %
Basophils Absolute: 0 10*3/uL (ref 0.0–0.1)
EOS ABS: 0.1 10*3/uL (ref 0.0–0.7)
Eosinophils Relative: 2 %
HEMATOCRIT: 40.7 % (ref 36.0–46.0)
Hemoglobin: 13.4 g/dL (ref 12.0–15.0)
Lymphocytes Relative: 21 %
Lymphs Abs: 0.6 10*3/uL — ABNORMAL LOW (ref 0.7–4.0)
MCH: 35.4 pg — ABNORMAL HIGH (ref 26.0–34.0)
MCHC: 32.9 g/dL (ref 30.0–36.0)
MCV: 107.4 fL — ABNORMAL HIGH (ref 78.0–100.0)
MONO ABS: 0.2 10*3/uL (ref 0.1–1.0)
MONOS PCT: 7 %
Neutro Abs: 2 10*3/uL (ref 1.7–7.7)
Neutrophils Relative %: 70 %
Platelets: 89 10*3/uL — ABNORMAL LOW (ref 150–400)
RBC: 3.79 MIL/uL — ABNORMAL LOW (ref 3.87–5.11)
RDW: 13.6 % (ref 11.5–15.5)
WBC: 2.9 10*3/uL — ABNORMAL LOW (ref 4.0–10.5)

## 2017-07-18 LAB — LIPASE, BLOOD: Lipase: 64 U/L — ABNORMAL HIGH (ref 11–51)

## 2017-07-18 LAB — TSH: TSH: 4.575 u[IU]/mL — ABNORMAL HIGH (ref 0.350–4.500)

## 2017-07-18 MED ORDER — SODIUM CHLORIDE 0.9 % IV BOLUS (SEPSIS)
1000.0000 mL | Freq: Once | INTRAVENOUS | Status: AC
  Administered 2017-07-18: 1000 mL via INTRAVENOUS

## 2017-07-18 MED ORDER — IOPAMIDOL (ISOVUE-300) INJECTION 61%
INTRAVENOUS | Status: AC
  Filled 2017-07-18: qty 100

## 2017-07-18 MED ORDER — IOPAMIDOL (ISOVUE-300) INJECTION 61%
100.0000 mL | Freq: Once | INTRAVENOUS | Status: AC | PRN
Start: 2017-07-18 — End: 2017-07-18
  Administered 2017-07-18: 80 mL via INTRAVENOUS

## 2017-07-18 MED ORDER — IOPAMIDOL (ISOVUE-300) INJECTION 61%: 30.0000 mL | Freq: Once | INTRAVENOUS | Status: DC | PRN

## 2017-07-18 MED ORDER — SODIUM CHLORIDE 0.9 % IV SOLN
1000.0000 mL | INTRAVENOUS | Status: DC
  Administered 2017-07-18: 1000 mL via INTRAVENOUS
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
## 2017-07-18 NOTE — ED Notes (Signed)
Patient transported to X-ray 

## 2017-07-18 NOTE — ED Notes (Signed)
Completed in and out cath on pt but there was no urine return.

## 2017-07-18 NOTE — ED Notes (Signed)
Bed: NO17 Expected date:  Expected time:  Means of arrival:  Comments: EMS 81 yo Not eating

## 2017-07-18 NOTE — ED Notes (Signed)
Patient is thin and cachetic. Continues stating "she doesn't want care however when explained what is going on patient is cooperative and calm. Continues stating "I don't want this on my lips." Patient's lips are dry and cracked. Oral care provided and moisturizer applied.

## 2017-07-18 NOTE — ED Notes (Signed)
PT IS REFUSING TO DRINK CONTRAST SOLUTION/
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
## 2017-07-21 LAB — URINE CULTURE

## 2017-07-22 ENCOUNTER — Telehealth: Payer: Self-pay | Admitting: Emergency Medicine

## 2017-07-22 NOTE — Progress Notes (Signed)
ED Antimicrobial Stewardship Positive Culture Follow Up   Kelsey Hebert is an 81 y.o. female who presented to [facility] on 07/18/2017 with a chief complaint of  Chief Complaint  Patient presents with  . Weakness  . Failure To Thrive    Recent Results (from the past 720 hour(s))  Urine culture     Status: Abnormal   Collection Time: 07/18/17  2:00 PM  Result Value Ref Range Status   Specimen Description URINE, RANDOM  Final   Special Requests NONE  Final   Culture >=100,000 COLONIES/mL ESCHERICHIA COLI (A)  Final   Report Status 07/21/2017 FINAL  Final   Organism ID, Bacteria ESCHERICHIA COLI (A)  Final      Susceptibility   Escherichia coli - MIC*    AMPICILLIN >=32 RESISTANT Resistant     CEFAZOLIN <=4 SENSITIVE Sensitive     CEFTRIAXONE <=1 SENSITIVE Sensitive     CIPROFLOXACIN <=0.25 SENSITIVE Sensitive     GENTAMICIN <=1 SENSITIVE Sensitive     IMIPENEM <=0.25 SENSITIVE Sensitive     NITROFURANTOIN <=16 SENSITIVE Sensitive     TRIMETH/SULFA <=20 SENSITIVE Sensitive     AMPICILLIN/SULBACTAM 16 INTERMEDIATE Intermediate     PIP/TAZO <=4 SENSITIVE Sensitive     Extended ESBL NEGATIVE Sensitive     * >=100,000 COLONIES/mL ESCHERICHIA COLI   [x]  Patient discharged originally without antimicrobial agent and treatment is now indicated  New antibiotic prescription: cephalexin 500 mg bid x 1 week  ED Provider: Avie Echevaria, PA-C  [name] 07/22/2017, 9:21 AM Infectious Diseases Pharmacist Phone# [phone]

## 2017-07-22 NOTE — Telephone Encounter (Signed)
Post ED Visit - Positive Culture Follow-up: Successful Patient Follow-Up  Culture assessed and recommendations reviewed by: []  Elenor Quinones, Pharm.D. []  Heide Guile, Pharm.D., BCPS AQ-ID [x]  Parks Neptune, Pharm.D., BCPS []  Alycia Rossetti, Pharm.D., BCPS []  [place], Pharm.D., BCPS, AAHIVP []  Legrand Como, Pharm.D., BCPS, AAHIVP []  Salome Arnt, PharmD, BCPS []  Dimitri Ped, PharmD, BCPS []  Vincenza Hews, PharmD, BCPS  Positive urine culture  [x]  Patient discharged without antimicrobial prescription and treatment is now indicated []  Organism is resistant to prescribed ED discharge antimicrobial []  Patient with positive blood cultures  Changes discussed with ED provider: Avie Echevaria PA New antibiotic prescription start Keflex 500mg  po bid x 1 week Attempting to contact patient      Hazle Nordmann 07/22/2017, 3:12 PM

## 2017-08-01 ENCOUNTER — Telehealth: Payer: Self-pay | Admitting: *Deleted

## 2017-09-08 ENCOUNTER — Inpatient Hospital Stay (HOSPITAL_COMMUNITY)
Admission: EM | Admit: 2017-09-08 | Discharge: 2017-09-20 | DRG: 682 | Disposition: E | Payer: Medicare HMO | Attending: Internal Medicine | Admitting: Internal Medicine

## 2017-09-08 ENCOUNTER — Encounter (HOSPITAL_COMMUNITY): Payer: Self-pay

## 2017-09-08 ENCOUNTER — Inpatient Hospital Stay (HOSPITAL_COMMUNITY): Payer: Medicare HMO

## 2017-09-08 ENCOUNTER — Emergency Department (HOSPITAL_COMMUNITY): Payer: Medicare HMO

## 2017-09-08 DIAGNOSIS — E871 Hypo-osmolality and hyponatremia: Secondary | ICD-10-CM | POA: Diagnosis not present

## 2017-09-08 DIAGNOSIS — Z681 Body mass index (BMI) 19 or less, adult: Secondary | ICD-10-CM | POA: Diagnosis not present

## 2017-09-08 DIAGNOSIS — E162 Hypoglycemia, unspecified: Secondary | ICD-10-CM

## 2017-09-08 DIAGNOSIS — M8580 Other specified disorders of bone density and structure, unspecified site: Secondary | ICD-10-CM | POA: Diagnosis present

## 2017-09-08 DIAGNOSIS — E860 Dehydration: Secondary | ICD-10-CM | POA: Diagnosis not present

## 2017-09-08 DIAGNOSIS — E870 Hyperosmolality and hypernatremia: Secondary | ICD-10-CM | POA: Diagnosis present

## 2017-09-08 DIAGNOSIS — E43 Unspecified severe protein-calorie malnutrition: Secondary | ICD-10-CM | POA: Diagnosis not present

## 2017-09-08 DIAGNOSIS — R000 Tachycardia, unspecified: Secondary | ICD-10-CM | POA: Diagnosis not present

## 2017-09-08 DIAGNOSIS — Z515 Encounter for palliative care: Secondary | ICD-10-CM | POA: Diagnosis not present

## 2017-09-08 DIAGNOSIS — Z96641 Presence of right artificial hip joint: Secondary | ICD-10-CM | POA: Diagnosis not present

## 2017-09-08 DIAGNOSIS — R630 Anorexia: Secondary | ICD-10-CM

## 2017-09-08 DIAGNOSIS — R001 Bradycardia, unspecified: Secondary | ICD-10-CM | POA: Diagnosis present

## 2017-09-08 DIAGNOSIS — R339 Retention of urine, unspecified: Secondary | ICD-10-CM | POA: Diagnosis present

## 2017-09-08 DIAGNOSIS — Z96649 Presence of unspecified artificial hip joint: Secondary | ICD-10-CM | POA: Diagnosis not present

## 2017-09-08 DIAGNOSIS — Z7189 Other specified counseling: Secondary | ICD-10-CM | POA: Diagnosis not present

## 2017-09-08 DIAGNOSIS — K7031 Alcoholic cirrhosis of liver with ascites: Secondary | ICD-10-CM | POA: Diagnosis present

## 2017-09-08 DIAGNOSIS — D649 Anemia, unspecified: Secondary | ICD-10-CM | POA: Diagnosis present

## 2017-09-08 DIAGNOSIS — I10 Essential (primary) hypertension: Secondary | ICD-10-CM | POA: Diagnosis present

## 2017-09-08 DIAGNOSIS — K7210 Chronic hepatic failure without coma: Secondary | ICD-10-CM | POA: Diagnosis not present

## 2017-09-08 DIAGNOSIS — D61818 Other pancytopenia: Secondary | ICD-10-CM | POA: Diagnosis not present

## 2017-09-08 DIAGNOSIS — K439 Ventral hernia without obstruction or gangrene: Secondary | ICD-10-CM | POA: Diagnosis not present

## 2017-09-08 DIAGNOSIS — R740 Nonspecific elevation of levels of transaminase and lactic acid dehydrogenase [LDH]: Secondary | ICD-10-CM | POA: Diagnosis not present

## 2017-09-08 DIAGNOSIS — Z86718 Personal history of other venous thrombosis and embolism: Secondary | ICD-10-CM | POA: Diagnosis not present

## 2017-09-08 DIAGNOSIS — R627 Adult failure to thrive: Secondary | ICD-10-CM | POA: Diagnosis present

## 2017-09-08 DIAGNOSIS — F0390 Unspecified dementia without behavioral disturbance: Secondary | ICD-10-CM

## 2017-09-08 DIAGNOSIS — R410 Disorientation, unspecified: Secondary | ICD-10-CM | POA: Diagnosis present

## 2017-09-08 DIAGNOSIS — Z66 Do not resuscitate: Secondary | ICD-10-CM | POA: Diagnosis present

## 2017-09-08 DIAGNOSIS — R531 Weakness: Secondary | ICD-10-CM | POA: Diagnosis not present

## 2017-09-08 DIAGNOSIS — K5900 Constipation, unspecified: Secondary | ICD-10-CM | POA: Diagnosis present

## 2017-09-08 DIAGNOSIS — K7469 Other cirrhosis of liver: Secondary | ICD-10-CM | POA: Diagnosis not present

## 2017-09-08 DIAGNOSIS — R634 Abnormal weight loss: Secondary | ICD-10-CM | POA: Diagnosis not present

## 2017-09-08 DIAGNOSIS — D638 Anemia in other chronic diseases classified elsewhere: Secondary | ICD-10-CM | POA: Diagnosis present

## 2017-09-08 DIAGNOSIS — R7401 Elevation of levels of liver transaminase levels: Secondary | ICD-10-CM

## 2017-09-08 DIAGNOSIS — J90 Pleural effusion, not elsewhere classified: Secondary | ICD-10-CM | POA: Diagnosis present

## 2017-09-08 DIAGNOSIS — Z23 Encounter for immunization: Secondary | ICD-10-CM | POA: Diagnosis not present

## 2017-09-08 DIAGNOSIS — R338 Other retention of urine: Secondary | ICD-10-CM

## 2017-09-08 DIAGNOSIS — N179 Acute kidney failure, unspecified: Secondary | ICD-10-CM | POA: Diagnosis not present

## 2017-09-08 DIAGNOSIS — K5730 Diverticulosis of large intestine without perforation or abscess without bleeding: Secondary | ICD-10-CM | POA: Diagnosis not present

## 2017-09-08 DIAGNOSIS — K8020 Calculus of gallbladder without cholecystitis without obstruction: Secondary | ICD-10-CM | POA: Diagnosis not present

## 2017-09-08 DIAGNOSIS — R5383 Other fatigue: Secondary | ICD-10-CM | POA: Diagnosis not present

## 2017-09-08 LAB — CBC WITH DIFFERENTIAL/PLATELET
Basophils Absolute: 0 10*3/uL (ref 0.0–0.1)
Basophils Relative: 0 %
EOS PCT: 0 %
Eosinophils Absolute: 0 10*3/uL (ref 0.0–0.7)
HCT: 40.2 % (ref 36.0–46.0)
Hemoglobin: 12.9 g/dL (ref 12.0–15.0)
Lymphocytes Relative: 16 %
Lymphs Abs: 0.7 10*3/uL (ref 0.7–4.0)
MCH: 35.6 pg — AB (ref 26.0–34.0)
MCHC: 32.1 g/dL (ref 30.0–36.0)
MCV: 111 fL — AB (ref 78.0–100.0)
MONO ABS: 0.2 10*3/uL (ref 0.1–1.0)
Monocytes Relative: 5 %
Neutro Abs: 3.4 10*3/uL (ref 1.7–7.7)
Neutrophils Relative %: 79 %
PLATELETS: 90 10*3/uL — AB (ref 150–400)
RBC: 3.62 MIL/uL — AB (ref 3.87–5.11)
RDW: 14.7 % (ref 11.5–15.5)
WBC: 4.3 10*3/uL (ref 4.0–10.5)

## 2017-09-08 LAB — BASIC METABOLIC PANEL
Anion gap: 6 (ref 5–15)
BUN: 97 mg/dL — AB (ref 6–20)
CHLORIDE: 124 mmol/L — AB (ref 101–111)
CO2: 30 mmol/L (ref 22–32)
CREATININE: 1.2 mg/dL — AB (ref 0.44–1.00)
Calcium: 8.6 mg/dL — ABNORMAL LOW (ref 8.9–10.3)
GFR calc Af Amer: 48 mL/min — ABNORMAL LOW (ref >60)
GFR calc non Af Amer: 42 mL/min — ABNORMAL LOW (ref >60)
Glucose, Bld: 88 mg/dL (ref 65–99)
Potassium: 4.3 mmol/L (ref 3.5–5.1)
SODIUM: 160 mmol/L — AB (ref 135–145)

## 2017-09-08 LAB — HEPATIC FUNCTION PANEL
ALT: 228 U/L — ABNORMAL HIGH (ref 14–54)
AST: 281 U/L — ABNORMAL HIGH (ref 15–41)
Albumin: 2.8 g/dL — ABNORMAL LOW (ref 3.5–5.0)
Alkaline Phosphatase: 191 U/L — ABNORMAL HIGH (ref 38–126)
BILIRUBIN DIRECT: 0.2 mg/dL (ref 0.1–0.5)
BILIRUBIN INDIRECT: 0.4 mg/dL (ref 0.3–0.9)
Total Bilirubin: 0.6 mg/dL (ref 0.3–1.2)
Total Protein: 5.8 g/dL — ABNORMAL LOW (ref 6.5–8.1)

## 2017-09-08 LAB — TSH: TSH: 6.223 u[IU]/mL — ABNORMAL HIGH (ref 0.350–4.500)

## 2017-09-08 MED ORDER — PRO-STAT SUGAR FREE PO LIQD
30.0000 mL | Freq: Three times a day (TID) | ORAL | Status: DC
  Administered 2017-09-09 – 2017-09-13 (×10): 30 mL via ORAL
  Filled 2017-09-08 (×10): qty 30

## 2017-09-08 MED ORDER — SODIUM CHLORIDE 0.9 % IV BOLUS (SEPSIS)
1000.0000 mL | Freq: Once | INTRAVENOUS | Status: AC
  Administered 2017-09-08: 1000 mL via INTRAVENOUS

## 2017-09-08 MED ORDER — ACETAMINOPHEN 325 MG PO TABS: 650.0000 mg | ORAL_TABLET | Freq: Four times a day (QID) | ORAL | Status: DC | PRN

## 2017-09-08 MED ORDER — ENSURE ENLIVE PO LIQD
237.0000 mL | Freq: Three times a day (TID) | ORAL | Status: DC
  Administered 2017-09-09 – 2017-09-14 (×13): 237 mL via ORAL

## 2017-09-08 MED ORDER — BISACODYL 5 MG PO TBEC: 5.0000 mg | DELAYED_RELEASE_TABLET | Freq: Every day | ORAL | Status: DC | PRN

## 2017-09-08 MED ORDER — ACETAMINOPHEN 650 MG RE SUPP: 650.0000 mg | Freq: Four times a day (QID) | RECTAL | Status: DC | PRN

## 2017-09-08 MED ORDER — DEXTROSE-NACL 5-0.45 % IV SOLN: INTRAVENOUS | Status: AC

## 2017-09-08 MED ORDER — MIRTAZAPINE 15 MG PO TBDP
15.0000 mg | ORAL_TABLET | Freq: Every day | ORAL | Status: DC
  Administered 2017-09-09 – 2017-09-14 (×5): 15 mg via ORAL
  Filled 2017-09-08 (×10): qty 1

## 2017-09-08 MED ORDER — IOPAMIDOL (ISOVUE-300) INJECTION 61%
INTRAVENOUS | Status: AC
  Filled 2017-09-08: qty 30

## 2017-09-08 MED ORDER — HEPARIN SODIUM (PORCINE) 5000 UNIT/ML IJ SOLN: 5000.0000 [IU] | Freq: Three times a day (TID) | INTRAMUSCULAR | Status: DC

## 2017-09-08 MED ORDER — LACTATED RINGERS IV SOLN
INTRAVENOUS | Status: DC
  Administered 2017-09-08 – 2017-09-09 (×2): via INTRAVENOUS

## 2017-09-08 MED ORDER — DEXTROSE-NACL 5-0.45 % IV SOLN
INTRAVENOUS | Status: DC
  Administered 2017-09-08 (×2): via INTRAVENOUS

## 2017-09-08 NOTE — ED Triage Notes (Signed)
Patient arrived via [facility] from home. Patient lives with her husband. Patient Alert to person, place, and time.  Patient not eating not drinking. Eats half a ensure a day. Past couple of weeks she has lost a lot of weight. Patient has progressively been getting worse for about a year now. Patient not taking care of herself. Earlier in january she was prescribed antibiotics for UTI that she will not take.
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
## 2017-09-08 NOTE — ED Notes (Signed)
Report given to Barbara, RN

## 2017-09-08 NOTE — ED Notes (Signed)
Bed: KB52 Expected date:  Expected time:  Means of arrival:  Comments: AMS, FTT
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
## 2017-09-08 NOTE — ED Notes (Signed)
Explained to pt we we needed an in and out cath. Pt refused. RN notified.
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
## 2017-09-09 ENCOUNTER — Inpatient Hospital Stay (HOSPITAL_COMMUNITY): Payer: Medicare HMO

## 2017-09-09 ENCOUNTER — Other Ambulatory Visit: Payer: Self-pay

## 2017-09-09 DIAGNOSIS — R627 Adult failure to thrive: Secondary | ICD-10-CM

## 2017-09-09 DIAGNOSIS — R630 Anorexia: Secondary | ICD-10-CM

## 2017-09-09 DIAGNOSIS — E43 Unspecified severe protein-calorie malnutrition: Secondary | ICD-10-CM

## 2017-09-09 LAB — CBC
HCT: 38.7 % (ref 36.0–46.0)
Hemoglobin: 12.2 g/dL (ref 12.0–15.0)
MCH: 34.9 pg — ABNORMAL HIGH (ref 26.0–34.0)
MCHC: 31.5 g/dL (ref 30.0–36.0)
MCV: 110.6 fL — ABNORMAL HIGH (ref 78.0–100.0)
Platelets: 67 10*3/uL — ABNORMAL LOW (ref 150–400)
RBC: 3.5 MIL/uL — ABNORMAL LOW (ref 3.87–5.11)
RDW: 14.3 % (ref 11.5–15.5)
WBC: 3.6 10*3/uL — AB (ref 4.0–10.5)

## 2017-09-09 LAB — BASIC METABOLIC PANEL
ANION GAP: 7 (ref 5–15)
ANION GAP: 8 (ref 5–15)
BUN: 87 mg/dL — AB (ref 6–20)
BUN: 86 mg/dL — ABNORMAL HIGH (ref 6–20)
CALCIUM: 8 mg/dL — AB (ref 8.9–10.3)
CHLORIDE: 124 mmol/L — AB (ref 101–111)
CO2: 28 mmol/L (ref 22–32)
CO2: 26 mmol/L (ref 22–32)
Calcium: 8.2 mg/dL — ABNORMAL LOW (ref 8.9–10.3)
Chloride: 125 mmol/L — ABNORMAL HIGH (ref 101–111)
Creatinine, Ser: 0.9 mg/dL (ref 0.44–1.00)
Creatinine, Ser: 0.88 mg/dL (ref 0.44–1.00)
GFR calc Af Amer: >60 mL/min (ref >60)
GFR calc Af Amer: >60 mL/min (ref >60)
GFR, EST NON AFRICAN AMERICAN: 59 mL/min — AB (ref >60)
Glucose, Bld: 69 mg/dL (ref 65–99)
Glucose, Bld: 75 mg/dL (ref 65–99)
POTASSIUM: 3.9 mmol/L (ref 3.5–5.1)
POTASSIUM: 3.7 mmol/L (ref 3.5–5.1)
SODIUM: 159 mmol/L — AB (ref 135–145)
Sodium: 159 mmol/L — ABNORMAL HIGH (ref 135–145)

## 2017-09-09 LAB — MAGNESIUM: MAGNESIUM: 2.6 mg/dL — AB (ref 1.7–2.4)

## 2017-09-09 LAB — VITAMIN B12: VITAMIN B 12: 4295 pg/mL — AB (ref 180–914)

## 2017-09-09 LAB — COMPREHENSIVE METABOLIC PANEL
ALBUMIN: 2.7 g/dL — AB (ref 3.5–5.0)
ALK PHOS: 171 U/L — AB (ref 38–126)
ALT: 197 U/L — ABNORMAL HIGH (ref 14–54)
ANION GAP: 6 (ref 5–15)
AST: 215 U/L — AB (ref 15–41)
BUN: 82 mg/dL — AB (ref 6–20)
CO2: 28 mmol/L (ref 22–32)
Calcium: 8.2 mg/dL — ABNORMAL LOW (ref 8.9–10.3)
Chloride: 124 mmol/L — ABNORMAL HIGH (ref 101–111)
Creatinine, Ser: 1.04 mg/dL — ABNORMAL HIGH (ref 0.44–1.00)
GFR calc Af Amer: 57 mL/min — ABNORMAL LOW (ref >60)
GFR, EST NON AFRICAN AMERICAN: 49 mL/min — AB (ref >60)
GLUCOSE: 86 mg/dL (ref 65–99)
Potassium: 3.9 mmol/L (ref 3.5–5.1)
Sodium: 158 mmol/L — ABNORMAL HIGH (ref 135–145)
TOTAL PROTEIN: 5.6 g/dL — AB (ref 6.5–8.1)
Total Bilirubin: 0.4 mg/dL (ref 0.3–1.2)

## 2017-09-09 LAB — T4, FREE: Free T4: 0.83 ng/dL (ref 0.61–1.12)

## 2017-09-09 LAB — PHOSPHORUS: PHOSPHORUS: 3.7 mg/dL (ref 2.5–4.6)

## 2017-09-09 LAB — CK: CK TOTAL: 78 U/L (ref 38–234)

## 2017-09-09 LAB — OSMOLALITY: Osmolality: 354 mOsm/kg (ref 275–295)

## 2017-09-09 MED ORDER — DEXTROSE 5 % IV SOLN
INTRAVENOUS | Status: DC
  Administered 2017-09-09 – 2017-09-14 (×3): via INTRAVENOUS

## 2017-09-09 MED ORDER — ONDANSETRON HCL 4 MG/2ML IJ SOLN: 4.0000 mg | Freq: Four times a day (QID) | INTRAMUSCULAR | Status: DC | PRN

## 2017-09-09 MED ORDER — INFLUENZA VAC SPLIT HIGH-DOSE 0.5 ML IM SUSY
0.5000 mL | PREFILLED_SYRINGE | INTRAMUSCULAR | Status: AC
  Administered 2017-09-14: 0.5 mL via INTRAMUSCULAR
  Filled 2017-09-09 (×2): qty 0.5

## 2017-09-09 MED ORDER — DEXTROSE-NACL 5-0.45 % IV SOLN
INTRAVENOUS | Status: DC
  Administered 2017-09-09: 13:00:00 via INTRAVENOUS

## 2017-09-09 MED ORDER — DEXTROSE-NACL 5-0.45 % IV SOLN
INTRAVENOUS | Status: DC
Start: 2017-09-09 — End: 2017-09-09

## 2017-09-09 MED ORDER — SODIUM CHLORIDE 0.9 % IV BOLUS (SEPSIS)
500.0000 mL | Freq: Once | INTRAVENOUS | Status: AC
  Administered 2017-09-09: 500 mL via INTRAVENOUS

## 2017-09-09 NOTE — Evaluation (Signed)
Physical Therapy Evaluation Patient Details Name: Kelsey Hebert MRN: [id number] DOB: 01/02/1936 Today's Date: 09/09/2017   History of Present Illness  82yo female brought in by family due to not eating or drinking well for several months, weight loss of 30-40#, and increased weakness. Diagnosed with severe dehydration causing hypernatremia, severe cachexia and malnutrition. PMH OA, HTN, malnutrition, osteopenia, hx DVT, hx R anterior hip 2017  Clinical Impression   Patient received in bed, pleasant and with husband present, who provided PLOF/home/equipment history; note patient was closely monitored today due to hypernatremic state. BP 110/97, HR 51 at rest in supine; unable to obtain accurate SpO2 reading throughout session due to poor perfusion. She requires ModA for functional bed mobility, BP 107/95 sitting at EOB, HR remaining at 51BPM. She is able to perform functional transfers with MinA and RW, however upon standing patient begins to state "I don't feel good" and demonstrating possible orthostatic symptoms, unable to obtain BP or HR measurements in standing today due to patient not being able to stand long enough to obtain values. Performed 2-3 sidesteps along EOB with patient very unsteady even with RW, requiring ModA to maintain balance. Did not progress gait today due to symptomatic state. She was left in bed with husband present, alarm activated, all needs otherwise met. Moving forward she will benefit from ST-SNF to further address functional deficits and reduce fall risk.     Follow Up Recommendations SNF    Equipment Recommendations  None recommended by PT(defer to next venue )    Recommendations for Other Services       Precautions / Restrictions Precautions Precautions: Fall Restrictions Weight Bearing Restrictions: No      Mobility  Bed Mobility Overal bed mobility: Needs Assistance Bed Mobility: Supine to Sit;Sit to Supine     Supine to sit: Mod assist Sit to  supine: Min guard   General bed mobility comments: ModA to bring trunk up and legs around for OOB, Min guard with extended time to return to bed   Transfers Overall transfer level: Needs assistance Equipment used: Rolling walker (2 wheeled) Transfers: Sit to/from Stand Sit to Stand: Min assist         General transfer comment: cues for hand placement and safety, light boost to come to full stand   Ambulation/Gait             General Gait Details: very unsteady once on feet with ModA required for side stepping along EOB, also becoming symptomatic in standing (possibly related to high sodium levels) so did not progrses gait today   Stairs            Wheelchair Mobility    Modified Rankin (Stroke Patients Only)       Balance Overall balance assessment: Needs assistance Sitting-balance support: Bilateral upper extremity supported;Feet supported Sitting balance-Leahy Scale: Fair Sitting balance - Comments: Min guard to maintain midline, generalized unsteadiness    Standing balance support: Bilateral upper extremity supported;During functional activity Standing balance-Leahy Scale: Poor Standing balance comment: ModA to maintain balance dynamically with RW                              Pertinent Vitals/Pain Pain Assessment: Faces Faces Pain Scale: Hurts a little bit Pain Location: generalized pain  Pain Descriptors / Indicators: Aching;Sore Pain Intervention(s): Limited activity within patient's tolerance;Monitored during session;Repositioned    Home Living Family/patient expects to be discharged to:: Private residence Living Arrangements: Spouse/significant   other Available Help at Discharge: Family Type of Home: House Home Access: Stairs to enter   [employer] of Steps: side door  Home Layout: One level Home Equipment: Environmental consultant - 2 wheels;Bedside commode;Shower seat      Prior Function Level of Independence: Independent with assistive  device(s)         Comments: per husband, uses RW      Hand Dominance        Extremity/Trunk Assessment   Upper Extremity Assessment Upper Extremity Assessment: Defer to OT evaluation    Lower Extremity Assessment Lower Extremity Assessment: Generalized weakness    Cervical / Trunk Assessment Cervical / Trunk Assessment: Kyphotic  Communication   Communication: No difficulties  Cognition Arousal/Alertness: Awake/alert Behavior During Therapy: Flat affect Overall Cognitive Status: Impaired/Different from baseline Area of Impairment: Orientation;Attention;Memory;Safety/judgement                 Orientation Level: Situation;Time Current Attention Level: Sustained Memory: Decreased short-term memory   Safety/Judgement: Decreased awareness of safety;Decreased awareness of deficits            General Comments      Exercises     Assessment/Plan    PT Assessment Patient needs continued PT services  PT Problem List Decreased strength;Decreased mobility;Decreased safety awareness;Decreased coordination;Decreased knowledge of precautions;Decreased activity tolerance;Decreased balance;Decreased knowledge of use of DME       PT Treatment Interventions DME instruction;Therapeutic activities;Gait training;Therapeutic exercise;Patient/family education;Stair training;Balance training;Functional mobility training;Neuromuscular re-education    PT Goals (Current goals can be found in the Care Plan section)  Acute Rehab PT Goals Patient Stated Goal: to get well PT Goal Formulation: With family Time For Goal Achievement: 09/23/17 Potential to Achieve Goals: Fair    Frequency Min 2X/week   Barriers to discharge        Co-evaluation               AM-PAC PT "6 Clicks" Daily Activity  Outcome Measure Difficulty turning over in bed (including adjusting bedclothes, sheets and blankets)?: Unable Difficulty moving from lying on back to sitting on the side of  the bed? : Unable Difficulty sitting down on and standing up from a chair with arms (e.g., wheelchair, bedside commode, etc,.)?: Unable Help needed moving to and from a bed to chair (including a wheelchair)?: A Lot Help needed walking in hospital room?: A Lot Help needed climbing 3-5 steps with a railing? : Total 6 Click Score: 8    End of Session Equipment Utilized During Treatment: Gait belt Activity Tolerance: Patient tolerated treatment well Patient left: in bed;with call bell/phone within reach;with bed alarm set;with family/visitor present   PT Visit Diagnosis: Unsteadiness on feet (R26.81);Muscle weakness (generalized) (M62.81);Difficulty in walking, not elsewhere classified (R26.2);Adult, failure to thrive (R62.7)    Time: 1025-1051 PT Time Calculation (min) (ACUTE ONLY): 26 min   Charges:   PT Evaluation $PT Eval Moderate Complexity: 1 Mod PT Treatments $Therapeutic Activity: 8-22 mins   PT G Codes:        Deniece Ree PT, DPT, CBIS  Supplemental Physical Therapist [place]   Pager [phone]

## 2017-09-09 NOTE — Progress Notes (Signed)
PT Cancellation Note  Patient Details Name: MIRI JOSE MRN: [id number] DOB: [birth year]   Cancelled Treatment:    Reason Eval/Treat Not Completed: Other (comment) Attempted to evaluate patient this morning, she was actively eating breakfast. Plan to return as/if able and schedule allows.    Deniece Ree PT, DPT, CBIS  Supplemental Physical Therapist [facility]   Pager [phone]

## 2017-09-09 NOTE — Progress Notes (Signed)
PROGRESS NOTE    Kelsey Hebert  ZOX:[id number] DOB: 02-Jan-1936 DOA: 09/13/2017 PCP: Orlena Sheldon, PA-C   Outpatient Specialists:    Brief Narrative:    Kelsey Hebert  is a 82 y.o. female, with past medical history of anemia of chronic disease, diverticulosis, hypertension, DVT was on Xarelto but has stopped it several years ago who lives with her husband and has been brought in by family members because she has not been eating or drinking well for several months, losing weight up to 30-40 pounds in the last 2 months and now getting extremely weak and unable to get out of the bed without help.  Patient surprisingly has no subjective complaints except poor appetite, generalized weakness, inability to get out of the bed without support, she denies any headache, no chest or abdominal pain, no blood in stool or urine, no dysuria or focal weakness.  She denies any history of malignancy.  Has not had physician follow-up in several months to years as they do not trust the assigned PA for medical care.  In the ER workup suggestive of severe dehydration, hypernatremia, extreme cachexia with severe protein calorie malnutrition.     Assessment & Plan:   Principal Problem:   Hypernatremia Active Problems:   Essential hypertension   Normocytic anemia   Severe protein-calorie malnutrition (HCC)   S/P total hip arthroplasty   Failure to thrive with severe dehydration malnutrition severe -IVF -poor intake for long time it seems, does not appear to be fear of food with mesenteric ischemia etc -remeron -minitor BMP -B12 -TSH elevated mildly-- check free t4  Hypernatremia due to poor intake -IVF -monitor BMP  elevated LFTs -no obvious causes on CT scan but limited to no contrast -check RUQ   Bradycardia -monitor closely -did not elevate with exertion per PT  Palliative care consult in AM if not much improved    DVT prophylaxis:  SCD's  Code Status: DNR   Family  Communication: Husband at bedside  Disposition Plan:  SNF?   Consultants:    Subjective: Does not want to eat  Objective: Vitals:   09/04/2017 1530 08/25/2017 1713 09/10/2017 2049 09/09/17 0624  BP: 110/68 107/80 113/76 121/69  Pulse:  (!) 52 (!) 119 (!) 48  Resp: 12 16 18 18   Temp:  98 F (36.7 C)    TempSrc:  Oral    SpO2:  100% 98% 96%    Intake/Output Summary (Last 24 hours) at 09/09/2017 1246 Last data filed at 09/09/2017 1109 Gross per 24 hour  Intake 1000 ml  Output 0 ml  Net 1000 ml   There were no vitals filed for this visit.  Examination:  General exam: cachectic, facial hair, mat of hair on top of head Respiratory system: Clear to auscultation. Respiratory effort normal. Cardiovascular system: S1 & S2 heard, RRR. No JVD, murmurs, rubs, gallops or clicks. No pedal edema. Gastrointestinal system: thin, +BS Central nervous system: Alert but not oriented Extremities: Symmetric 5 x 5 power. Skin: dry skin     Data Reviewed: I have personally reviewed following labs and imaging studies  CBC: Recent Labs  Lab 08/25/2017 1249 09/09/17 0423  WBC 4.3 3.6*  NEUTROABS 3.4  --   HGB 12.9 12.2  HCT 40.2 38.7  MCV 111.0* 110.6*  PLT 90* 67*   Basic Metabolic Panel: Recent Labs  Lab 09/05/2017 1249 09/09/17 0423  NA 160* 158*  K 4.3 3.9  CL 124* 124*  CO2 30 28  GLUCOSE 88 86  BUN 97* 82*  CREATININE 1.20* 1.04*  CALCIUM 8.6* 8.2*  MG  --  2.6*  PHOS  --  3.7   GFR: CrCl cannot be calculated (Unknown ideal weight.). Liver Function Tests: Recent Labs  Lab 09/01/2017 1258 09/09/17 0423  AST 281* 215*  ALT 228* 197*  ALKPHOS 191* 171*  BILITOT 0.6 0.4  PROT 5.8* 5.6*  ALBUMIN 2.8* 2.7*   No results for input(s): LIPASE, AMYLASE in the last 168 hours. No results for input(s): AMMONIA in the last 168 hours. Coagulation Profile: No results for input(s): INR, PROTIME in the last 168 hours. Cardiac Enzymes: Recent Labs  Lab 09/09/17 0423    CKTOTAL 78   BNP (last 3 results) No results for input(s): PROBNP in the last 8760 hours. HbA1C: No results for input(s): HGBA1C in the last 72 hours. CBG: No results for input(s): GLUCAP in the last 168 hours. Lipid Profile: No results for input(s): CHOL, HDL, LDLCALC, TRIG, CHOLHDL, LDLDIRECT in the last 72 hours. Thyroid Function Tests: Recent Labs    09/04/2017 1711  TSH 6.223*   Anemia Panel: No results for input(s): VITAMINB12, FOLATE, FERRITIN, TIBC, IRON, RETICCTPCT in the last 72 hours. Urine analysis:    Component Value Date/Time   COLORURINE YELLOW 07/18/2017 1400   APPEARANCEUR HAZY (A) 07/18/2017 1400   LABSPEC 1.029 07/18/2017 1400   PHURINE 7.0 07/18/2017 1400   GLUCOSEU NEGATIVE 07/18/2017 1400   HGBUR NEGATIVE 07/18/2017 1400   BILIRUBINUR NEGATIVE 07/18/2017 1400   KETONESUR NEGATIVE 07/18/2017 1400   PROTEINUR NEGATIVE 07/18/2017 1400   NITRITE POSITIVE (A) 07/18/2017 1400   LEUKOCYTESUR NEGATIVE 07/18/2017 1400     )No results found for this or any previous visit (from the past 240 hour(s)).    Anti-infectives (From admission, onward)   None       Radiology Studies: Ct Abdomen Pelvis Wo Contrast  Result Date: 08/31/2017 CLINICAL DATA:  Anemia of chronic disease, diverticulosis, and hypertension. Lack of appetite over the last several months. Weight loss. EXAM: CT ABDOMEN AND PELVIS WITHOUT CONTRAST TECHNIQUE: Multidetector CT imaging of the abdomen and pelvis was performed following the standard protocol without IV contrast. COMPARISON:  July 18, 2017 FINDINGS: Evaluation of the abdomen and pelvis is nearly nondiagnostic due the lack of intravenous contrast, persistent increased attenuation in the subcutaneous and intra-abdominal fat, and the lack of intra-abdominal fat. Lower chest: No acute abnormality. Hepatobiliary: Increased attenuation the gallbladder is consistent with stones or sludge. No acute abnormalities identified in the liver.  Pancreas: Very poor evaluation due to the limitations of the study without obvious abnormality. Spleen: Poorly evaluated due to the limitation of the study without obvious abnormality. Adrenals/Urinary Tract: The adrenal glands are not well visualized. The kidneys are poorly evaluated. There is probably a punctate stone in the lower right kidney on series 4, image 52 measuring 2 or 3 mm. No definitive left-sided stones. No definitive hydronephrosis. Limited views of the bladder are unremarkable. The ureters are not well assessed. Stomach/Bowel: The stomach and small bowel are poorly assessed. The colon is poorly assessed as well. [place] without obvious diverticulitis. The appendix is not visualized. Vascular/Lymphatic: Atherosclerotic change in the nonaneurysmal aorta and branching vessels. Evaluation for adenopathy is limited. Reproductive: The uterus and ovaries are poorly evaluated. Other: Hernia mesh in the lower abdomen.  No free air or free fluid. Musculoskeletal: No acute or significant osseous findings. IMPRESSION: 1. Markedly limited study due the lack of intra-abdominal fat and contrast. 2. Stones or sludge in the  gallbladder. 3. Probable punctate stone in the right kidney without obvious hydronephrosis. 4. Atherosclerotic change in the aorta and branching vessels. Electronically Signed   By: Dorise Bullion III M.D   On: 09/10/2017 17:12   Dg Chest Portable 1 View  Result Date: 09/11/2017 CLINICAL DATA:  Failure to thrive. EXAM: PORTABLE CHEST 1 VIEW COMPARISON:  07/18/2017 and 12/05/2015 FINDINGS: Lungs are clear. Cardiomediastinal silhouette is within normal. There are degenerative changes of the spine. IMPRESSION: No active disease. Electronically Signed   By: Marin Olp M.D.   On: 09/18/2017 13:22        Scheduled Meds: . feeding supplement (ENSURE ENLIVE)  237 mL Oral TID BM  . feeding supplement (PRO-STAT SUGAR FREE 64)  30 mL Oral TID WC  . [START ON 09/10/2017] Influenza vac  split quadrivalent PF  0.5 mL Intramuscular Tomorrow-1000  . mirtazapine  15 mg Oral QHS   Continuous Infusions: . dextrose 5 % and 0.45% NaCl 100 mL/hr at 09/09/17 1239  . lactated ringers 100 mL/hr at 09/09/17 0821  . sodium chloride 500 mL (09/09/17 1239)     LOS: 1 day    Time spent: 35 min    Geradine Girt, DO [facility] Pager [phone]  If 7PM-7AM, please contact night-coverage [email or web address] Password [facility] 09/09/2017, 12:46 PM

## 2017-09-10 DIAGNOSIS — I10 Essential (primary) hypertension: Secondary | ICD-10-CM

## 2017-09-10 LAB — URINALYSIS, ROUTINE W REFLEX MICROSCOPIC
Bilirubin Urine: NEGATIVE
Glucose, UA: NEGATIVE mg/dL
Hgb urine dipstick: NEGATIVE
KETONES UR: NEGATIVE mg/dL
Nitrite: POSITIVE — AB
PH: 5 (ref 5.0–8.0)
PROTEIN: NEGATIVE mg/dL
Specific Gravity, Urine: 1.021 (ref 1.005–1.030)

## 2017-09-10 LAB — COMPREHENSIVE METABOLIC PANEL
ALT: 310 U/L — ABNORMAL HIGH (ref 14–54)
AST: 486 U/L — AB (ref 15–41)
Albumin: 2.6 g/dL — ABNORMAL LOW (ref 3.5–5.0)
Alkaline Phosphatase: 212 U/L — ABNORMAL HIGH (ref 38–126)
Anion gap: 8 (ref 5–15)
BILIRUBIN TOTAL: 0.5 mg/dL (ref 0.3–1.2)
BUN: 81 mg/dL — AB (ref 6–20)
CHLORIDE: 122 mmol/L — AB (ref 101–111)
CO2: 27 mmol/L (ref 22–32)
CREATININE: 0.91 mg/dL (ref 0.44–1.00)
Calcium: 8.1 mg/dL — ABNORMAL LOW (ref 8.9–10.3)
GFR calc Af Amer: >60 mL/min (ref >60)
GFR, EST NON AFRICAN AMERICAN: 58 mL/min — AB (ref >60)
Glucose, Bld: 70 mg/dL (ref 65–99)
POTASSIUM: 3.8 mmol/L (ref 3.5–5.1)
Sodium: 157 mmol/L — ABNORMAL HIGH (ref 135–145)
Total Protein: 5.3 g/dL — ABNORMAL LOW (ref 6.5–8.1)

## 2017-09-10 LAB — CREATININE, URINE, RANDOM: Creatinine, Urine: 54.28 mg/dL

## 2017-09-10 LAB — SODIUM, URINE, RANDOM: Sodium, Ur: 38 mmol/L

## 2017-09-10 LAB — OSMOLALITY, URINE: Osmolality, Ur: 827 mOsm/kg (ref 300–900)

## 2017-09-10 MED ORDER — NYSTATIN 100000 UNIT/ML MT SUSP
5.0000 mL | Freq: Four times a day (QID) | OROMUCOSAL | Status: DC
  Administered 2017-09-10 – 2017-09-12 (×9): 500000 [IU] via ORAL
  Filled 2017-09-10 (×8): qty 5

## 2017-09-10 MED ORDER — SODIUM CHLORIDE 0.9 % IV SOLN
1.0000 g | INTRAVENOUS | Status: DC
  Administered 2017-09-10 – 2017-09-11 (×2): 1 g via INTRAVENOUS
  Filled 2017-09-10 (×2): qty 1

## 2017-09-10 MED ORDER — ADULT MULTIVITAMIN LIQUID CH
15.0000 mL | Freq: Every day | ORAL | Status: DC
  Administered 2017-09-10 – 2017-09-13 (×4): 15 mL via ORAL
  Filled 2017-09-10 (×8): qty 15

## 2017-09-10 NOTE — Progress Notes (Signed)
PROGRESS NOTE    [name]  WJX:[id number] DOB: 1936/05/13 DOA: 09/12/2017 PCP: Orlena Sheldon, PA-C   Outpatient Specialists:    Brief Narrative:    Kelsey Hebert  is a 82 y.o. female, with past medical history of anemia of chronic disease, diverticulosis, hypertension, DVT was on Xarelto but has stopped it several years ago who lives with her husband and has been brought in by family members because she has not been eating or drinking well for several months, losing weight up to 30-40 pounds in the last 2 months and now getting extremely weak and unable to get out of the bed without help.  Patient surprisingly has no subjective complaints except poor appetite, generalized weakness, inability to get out of the bed without support, she denies any headache, no chest or abdominal pain, no blood in stool or urine, no dysuria or focal weakness.  She denies any history of malignancy.  Has not had physician follow-up in several months to years as they do not trust the assigned PA for medical care.  In the ER workup suggestive of severe dehydration, hypernatremia, extreme cachexia with severe protein calorie malnutrition.     Assessment & Plan:   Principal Problem:   Hypernatremia Active Problems:   Essential hypertension   Normocytic anemia   Severe protein-calorie malnutrition (HCC)   S/P total hip arthroplasty   Failure to thrive with severe dehydration malnutrition severe -IVF- D5W for hypernatremia -poor intake for long time it seems, does not appear to be fear of food with mesenteric ischemia etc -? Malignancy but not sure what kind -remeron QHS -minitor CMP -B12 markedly elevated (? Cirrhosis) -TSH elevated mildly- with normal fT4 -monitor Mg/Ph with refeeding syndrome -nutrition consult  Hypernatremia due to poor intake -IVF- D5W -monitor closely  elevated LFTs -no obvious causes on CT scan but limited with no contrast -RUQ : 1. Uncomplicated  cholelithiasis. 2. Echogenic liver parenchyma with area of focal fatty sparing suspected measuring 4.2 x 3.3 x 2.9 cm. A subtle underlying hepatic lesion is believed less likely but cannot be entirely excluded. Consider nonemergent cross-sectional imaging without and with IV contrast using a liver lesion protocol for better assessment. 3. Slight undulating surface contour of the liver morphologically compatible with cirrhosis. Small volume of ascites.  Bradycardia -improving with nutrition  Cirrhosis -never a drinker -viral hepatitis panel pending -monitor daily  Possible UTI -culture pending -rocephin IV  Dementia -no behavioral issues  Palliative care consult-- suspect a malignancy driving     DVT prophylaxis:  SCD's  Code Status: DNR   Family Communication: Husband at bedside  Disposition Plan:  SNF?   Consultants:    Subjective: Per patient she is eating better  Objective: Vitals:   09/09/17 0624 09/09/17 1604 09/09/17 2106 09/10/17 0433  BP: 121/69 127/83 103/78 118/68  Pulse: (!) 48 (!) 50 67 65  Resp: 18 18 12 10   Temp:  98.2 F (36.8 C) 98.4 F (36.9 C) 98.2 F (36.8 C)  TempSrc:  Oral Oral Oral  SpO2: 96% 98% 99% 100%    Intake/Output Summary (Last 24 hours) at 09/10/2017 1316 Last data filed at 09/10/2017 0400 Gross per 24 hour  Intake 652.5 ml  Output 100 ml  Net 552.5 ml   There were no vitals filed for this visit.  Examination:  General exam: flu mask on head Respiratory system: no wheezing, no increase work of breathing Cardiovascular system: rrr Gastrointestinal system: thin with 2 mobile masses on upper abd Central  nervous system: alert/pleasant      Data Reviewed: I have personally reviewed following labs and imaging studies  CBC: Recent Labs  Lab 09/07/2017 1249 09/09/17 0423  WBC 4.3 3.6*  NEUTROABS 3.4  --   HGB 12.9 12.2  HCT 40.2 38.7  MCV 111.0* 110.6*  PLT 90* 67*   Basic Metabolic Panel: Recent Labs   Lab 09/16/2017 1249 09/09/17 0423 09/09/17 1235 09/09/17 2046 09/10/17 0357  NA 160* 158* 159* 159* 157*  K 4.3 3.9 3.9 3.7 3.8  CL 124* 124* 124* 125* 122*  CO2 30 28 28 26 27   GLUCOSE 88 86 69 75 70  BUN 97* 82* 87* 86* 81*  CREATININE 1.20* 1.04* 0.90 0.88 0.91  CALCIUM 8.6* 8.2* 8.2* 8.0* 8.1*  MG  --  2.6*  --   --   --   PHOS  --  3.7  --   --   --    GFR: CrCl cannot be calculated (Unknown ideal weight.). Liver Function Tests: Recent Labs  Lab 09/01/2017 1258 09/09/17 0423 09/10/17 0357  AST 281* 215* 486*  ALT 228* 197* 310*  ALKPHOS 191* 171* 212*  BILITOT 0.6 0.4 0.5  PROT 5.8* 5.6* 5.3*  ALBUMIN 2.8* 2.7* 2.6*   No results for input(s): LIPASE, AMYLASE in the last 168 hours. No results for input(s): AMMONIA in the last 168 hours. Coagulation Profile: No results for input(s): INR, PROTIME in the last 168 hours. Cardiac Enzymes: Recent Labs  Lab 09/09/17 0423  CKTOTAL 78   BNP (last 3 results) No results for input(s): PROBNP in the last 8760 hours. HbA1C: No results for input(s): HGBA1C in the last 72 hours. CBG: No results for input(s): GLUCAP in the last 168 hours. Lipid Profile: No results for input(s): CHOL, HDL, LDLCALC, TRIG, CHOLHDL, LDLDIRECT in the last 72 hours. Thyroid Function Tests: Recent Labs    08/23/2017 1711 09/09/17 1235  TSH 6.223*  --   FREET4  --  0.83   Anemia Panel: Recent Labs    09/09/17 1235  VITAMINB12 4,295*   Urine analysis:    Component Value Date/Time   COLORURINE YELLOW 09/10/2017 0108   APPEARANCEUR CLEAR 09/10/2017 0108   LABSPEC 1.021 09/10/2017 0108   PHURINE 5.0 09/10/2017 0108   GLUCOSEU NEGATIVE 09/10/2017 0108   HGBUR NEGATIVE 09/10/2017 0108   BILIRUBINUR NEGATIVE 09/10/2017 0108   KETONESUR NEGATIVE 09/10/2017 0108   PROTEINUR NEGATIVE 09/10/2017 0108   NITRITE POSITIVE (A) 09/10/2017 0108   LEUKOCYTESUR SMALL (A) 09/10/2017 0108     )No results found for this or any previous visit (from  the past 240 hour(s)).    Anti-infectives (From admission, onward)   Start     Dose/Rate Route Frequency Ordered Stop   09/10/17 1100  cefTRIAXone (ROCEPHIN) 1 g in sodium chloride 0.9 % 100 mL IVPB     1 g 200 mL/hr over 30 Minutes Intravenous Every 24 hours 09/10/17 1025         Radiology Studies: Ct Abdomen Pelvis Wo Contrast  Result Date: 09/11/2017 CLINICAL DATA:  Anemia of chronic disease, diverticulosis, and hypertension. Lack of appetite over the last several months. Weight loss. EXAM: CT ABDOMEN AND PELVIS WITHOUT CONTRAST TECHNIQUE: Multidetector CT imaging of the abdomen and pelvis was performed following the standard protocol without IV contrast. COMPARISON:  July 18, 2017 FINDINGS: Evaluation of the abdomen and pelvis is nearly nondiagnostic due the lack of intravenous contrast, persistent increased attenuation in the subcutaneous and intra-abdominal fat, and the  lack of intra-abdominal fat. Lower chest: No acute abnormality. Hepatobiliary: Increased attenuation the gallbladder is consistent with stones or sludge. No acute abnormalities identified in the liver. Pancreas: Very poor evaluation due to the limitations of the study without obvious abnormality. Spleen: Poorly evaluated due to the limitation of the study without obvious abnormality. Adrenals/Urinary Tract: The adrenal glands are not well visualized. The kidneys are poorly evaluated. There is probably a punctate stone in the lower right kidney on series 4, image 52 measuring 2 or 3 mm. No definitive left-sided stones. No definitive hydronephrosis. Limited views of the bladder are unremarkable. The ureters are not well assessed. Stomach/Bowel: The stomach and small bowel are poorly assessed. The colon is poorly assessed as well. [place] without obvious diverticulitis. The appendix is not visualized. Vascular/Lymphatic: Atherosclerotic change in the nonaneurysmal aorta and branching vessels. Evaluation for adenopathy is  limited. Reproductive: The uterus and ovaries are poorly evaluated. Other: Hernia mesh in the lower abdomen.  No free air or free fluid. Musculoskeletal: No acute or significant osseous findings. IMPRESSION: 1. Markedly limited study due the lack of intra-abdominal fat and contrast. 2. Stones or sludge in the gallbladder. 3. Probable punctate stone in the right kidney without obvious hydronephrosis. 4. Atherosclerotic change in the aorta and branching vessels. Electronically Signed   By: Dorise Bullion III M.D   On: 09/13/2017 17:12   US Abdomen Limited Ruq  Result Date: 09/09/2017 CLINICAL DATA:  Anorexia EXAM: ULTRASOUND ABDOMEN LIMITED RIGHT UPPER QUADRANT COMPARISON:  Noncontrast CT from 08/23/2017. FINDINGS: Gallbladder: Numerous layering gallstones are noted without secondary signs of acute cholecystitis. The largest gallstone was approximately 8 mm. No pericholecystic fluid or mural thickening of the gallbladder is noted. No sonographic Murphy sign noted by sonographer. Common bile duct: Diameter: 3.3 mm and normal. Liver: Small amount of ascites is noted about the liver. Morphologic appearance of cirrhosis. The liver parenchyma is predominantly echogenic with area of hypoechogenicity anteriorly measuring 4.2 x 3.3 x 2.9 cm that is more likely an area focal fatty sparing as opposed to an underlying lesion. CT with without and with IV contrast may help for better assessment. Trace intrahepatic biliary dilatation. IMPRESSION: 1. Uncomplicated cholelithiasis. 2. Echogenic liver parenchyma with area of focal fatty sparing suspected measuring 4.2 x 3.3 x 2.9 cm. A subtle underlying hepatic lesion is believed less likely but cannot be entirely excluded. Consider nonemergent cross-sectional imaging without and with IV contrast using a liver lesion protocol for better assessment. 3. Slight undulating surface contour of the liver morphologically compatible with cirrhosis. Small volume of ascites. Electronically  Signed   By: Ashley Royalty M.D.   On: 09/09/2017 14:57        Scheduled Meds: . feeding supplement (ENSURE ENLIVE)  237 mL Oral TID BM  . feeding supplement (PRO-STAT SUGAR FREE 64)  30 mL Oral TID WC  . Influenza vac split quadrivalent PF  0.5 mL Intramuscular Tomorrow-1000  . mirtazapine  15 mg Oral QHS  . multivitamin  15 mL Oral Daily  . nystatin  5 mL Oral QID   Continuous Infusions: . cefTRIAXone (ROCEPHIN)  IV 1 g (09/10/17 1200)  . dextrose 100 mL (09/10/17 0935)  . lactated ringers 100 mL/hr at 09/09/17 0821     LOS: 2 days    Time spent: 35 min    Geradine Girt, DO [facility] Pager [phone]  If 7PM-7AM, please contact night-coverage [email or web address] Password [email or web address] 09/10/2017, 1:16 PM

## 2017-09-10 NOTE — Progress Notes (Signed)
Calorie Count Note  48 hour calorie count ordered. RD placed envelope on patient's door.   Staff to document percent consumed for each item on the patient's meal tray ticket and keep in envelope. Also document percent of any supplement or snack pt consumes and keep documentation in envelope for RD to review.   Clayton Bibles, MS, RD, [place] Dietitian Pager: [phone] After Hours Pager: [phone]

## 2017-09-10 NOTE — Clinical Social Work Note (Signed)
Clinical Social Work Assessment  Patient Details  Name: Kelsey Hebert MRN: [id number] Date of Birth: 1936-02-22  Date of referral:  09/10/17               Reason for consult:  Emotional/Coping/Adjustment to Illness, Facility Placement                Permission sought to share information with:  Family Supports Permission granted to share information::  Yes, Verbal Permission Granted  Name::     husband Hester Mates::     Relationship::     Contact Information:     Housing/Transportation Living arrangements for the past 2 months:  Single Family Home Source of Information:  Patient, Spouse, Medical Team Patient Interpreter Needed:  None Criminal Activity/Legal Involvement Pertinent to Current Situation/Hospitalization:  No - Comment as needed Significant Relationships:  Spouse, Adult Children Lives with:  Spouse Do you feel safe going back to the place where you live?  Yes Need for family participation in patient care:  Yes (Comment)(pt's husband involved)  Care giving concerns:  Pt admitted from home where she resides with her husband. She reports that over the past month she "lost her appetite, stopped eating or drinking." Husband states this lead to severe weakness. "Before that she was getting around okay, sometimes used a walker."  Husband states as pt has become weaker he has been helping her out of bed and prompting her to eat but she has not been able to. States that "we were bathing her in the bed, can't stand up to shower."  In the ER workup suggestive of severe dehydration, hypernatremia, extreme cachexia with severe protein calorie malnutrition.   Social Worker assessment / plan:  CSW following to assess for needs and disposition. Met with pt and husband at bedside. Just prior to assessment pt had had episode of confusion/agitation per RN, however pt now oriented x4 and calm but seems guarded.  Husband present but did not make eye contact with CSW and answered questions  minimally.  Gathered description of care needs above and began discussion of potential needs at DC. Both pt and husband decline any interest in facility placement or in home health. Husband states, "If she can start eating and drinking, she'll get her strength back and be fine again at home." CSW discussed with attending- palliative medicine consult, suspect malignancy driving pt's failure to thrive.  Plan: TBD- medical workup pending. Thus far pt/husband not interested in placement options or home health.   Employment status:    Forensic scientist:  Managed Care PT Recommendations:  [place] / Referral to community resources:     Patient/Family's Response to care:  Unremarkable- participate minimally in discussion  Patient/Family's Understanding of and Emotional Response to Diagnosis, Current Treatment, and Prognosis:  Both pt and husband seem to understand pt's current treatment and medical issues but were very abrupt in answers to care questions and ad flat emotional response. Of note pt had episode of agitation/confusion prior to this assessment, which may have weight on their affect when CSW arrived.   Emotional Assessment Appearance:  Appears stated age, Disheveled Attitude/Demeanor/Rapport:  Engaged(but guarded) Affect (typically observed):  Calm(however had episode of confusion just prior to assessment per RN) Orientation:  Oriented to Self, Oriented to Place, Oriented to  Time, Oriented to Situation Alcohol / Substance use:  Not Applicable Psych involvement (Current and /or in the community):  No (Comment)  Discharge Needs  Concerns to be addressed:  Decision  making concerns, Care Coordination Readmission within the last 30 days:  No Current discharge risk:  (still assessing) Barriers to Discharge:  Continued Medical Work up   [employer], LCSW 09/10/2017, 2:26 PM [phone]

## 2017-09-11 ENCOUNTER — Inpatient Hospital Stay (HOSPITAL_COMMUNITY): Payer: Medicare HMO

## 2017-09-11 DIAGNOSIS — R740 Nonspecific elevation of levels of transaminase and lactic acid dehydrogenase [LDH]: Secondary | ICD-10-CM

## 2017-09-11 DIAGNOSIS — E870 Hyperosmolality and hypernatremia: Secondary | ICD-10-CM

## 2017-09-11 DIAGNOSIS — R7401 Elevation of levels of liver transaminase levels: Secondary | ICD-10-CM

## 2017-09-11 LAB — COMPREHENSIVE METABOLIC PANEL
ALT: 504 U/L — AB (ref 14–54)
ANION GAP: 7 (ref 5–15)
AST: 820 U/L — ABNORMAL HIGH (ref 15–41)
Albumin: 2.6 g/dL — ABNORMAL LOW (ref 3.5–5.0)
Alkaline Phosphatase: 302 U/L — ABNORMAL HIGH (ref 38–126)
BUN: 66 mg/dL — ABNORMAL HIGH (ref 6–20)
CHLORIDE: 115 mmol/L — AB (ref 101–111)
CO2: 26 mmol/L (ref 22–32)
Calcium: 8.1 mg/dL — ABNORMAL LOW (ref 8.9–10.3)
Creatinine, Ser: 0.92 mg/dL (ref 0.44–1.00)
GFR, EST NON AFRICAN AMERICAN: 57 mL/min — AB (ref >60)
Glucose, Bld: 158 mg/dL — ABNORMAL HIGH (ref 65–99)
POTASSIUM: 3.3 mmol/L — AB (ref 3.5–5.1)
SODIUM: 148 mmol/L — AB (ref 135–145)
Total Bilirubin: 0.4 mg/dL (ref 0.3–1.2)
Total Protein: 5.7 g/dL — ABNORMAL LOW (ref 6.5–8.1)

## 2017-09-11 LAB — CBC
HEMATOCRIT: 40.3 % (ref 36.0–46.0)
HEMOGLOBIN: 13.5 g/dL (ref 12.0–15.0)
MCH: 35.9 pg — ABNORMAL HIGH (ref 26.0–34.0)
MCHC: 33.5 g/dL (ref 30.0–36.0)
MCV: 107.2 fL — ABNORMAL HIGH (ref 78.0–100.0)
Platelets: 63 10*3/uL — ABNORMAL LOW (ref 150–400)
RBC: 3.76 MIL/uL — ABNORMAL LOW (ref 3.87–5.11)
RDW: 13.9 % (ref 11.5–15.5)
WBC: 5.1 10*3/uL (ref 4.0–10.5)

## 2017-09-11 LAB — HEPATITIS PANEL, ACUTE
HEP A IGM: NEGATIVE
HEP B C IGM: NEGATIVE
HEP B S AG: NEGATIVE

## 2017-09-11 LAB — URINE CULTURE

## 2017-09-11 LAB — MAGNESIUM: Magnesium: 2.4 mg/dL (ref 1.7–2.4)

## 2017-09-11 LAB — PHOSPHORUS: PHOSPHORUS: 1.7 mg/dL — AB (ref 2.5–4.6)

## 2017-09-11 MED ORDER — IOPAMIDOL (ISOVUE-300) INJECTION 61%
100.0000 mL | Freq: Once | INTRAVENOUS | Status: AC | PRN
  Administered 2017-09-11: 80 mL via INTRAVENOUS

## 2017-09-11 MED ORDER — POTASSIUM CHLORIDE CRYS ER 20 MEQ PO TBCR
40.0000 meq | EXTENDED_RELEASE_TABLET | Freq: Once | ORAL | Status: AC
  Administered 2017-09-11: 40 meq via ORAL
  Filled 2017-09-11: qty 2

## 2017-09-11 MED ORDER — IOPAMIDOL (ISOVUE-300) INJECTION 61%
INTRAVENOUS | Status: AC
  Filled 2017-09-11: qty 30

## 2017-09-11 MED ORDER — IOPAMIDOL (ISOVUE-300) INJECTION 61%
INTRAVENOUS | Status: AC
  Filled 2017-09-11: qty 100

## 2017-09-11 MED ORDER — IOPAMIDOL (ISOVUE-300) INJECTION 61%: 100.0000 mL | Freq: Once | INTRAVENOUS | Status: DC | PRN

## 2017-09-11 MED ORDER — POTASSIUM PHOSPHATE MONOBASIC 500 MG PO TABS
500.0000 mg | ORAL_TABLET | Freq: Three times a day (TID) | ORAL | Status: DC
  Administered 2017-09-11 – 2017-09-13 (×5): 500 mg via ORAL
  Filled 2017-09-11 (×8): qty 1

## 2017-09-11 MED ORDER — MORPHINE SULFATE (PF) 2 MG/ML IV SOLN
2.0000 mg | INTRAVENOUS | Status: DC | PRN
  Administered 2017-09-12: 2 mg via INTRAVENOUS
  Filled 2017-09-11 (×2): qty 1

## 2017-09-11 MED ORDER — IOPAMIDOL (ISOVUE-300) INJECTION 61%: 15.0000 mL | Freq: Once | INTRAVENOUS | Status: DC | PRN

## 2017-09-11 NOTE — Progress Notes (Signed)
Physical Therapy Treatment Patient Details Name: Kelsey Hebert MRN: [id number] DOB: Sep 28, 1935 Today's Date: 09/11/2017    History of Present Illness 82yo female brought in by family due to not eating or drinking well for several months, weight loss of 30-40#, and increased weakness. Diagnosed with severe dehydration causing hypernatremia, severe cachexia and malnutrition. PMH OA, HTN, malnutrition, osteopenia, hx DVT, hx R anterior hip 2017    PT Comments    Assisted pt OOB to amb to bathroom required MAX encouragement and direction.  Pt restless, wanting to get dressed and go home.  Pt knows she is in [facility] and she was following all my commands but was anxious and persistent.  With assist, pt did amb to bathroom with walker but was VERY unsteady.  Poor balance and poor self correction.  Weak.  VERY deconditioned and under weight.  Assisted with hygiene after pt voided, applied a clean gown then assisted back to bed.  Pt became even more restless about going home and getting some cloths on. Redirected her that the Doctor has not released her.   Assisted back to bed and applied bed alarm when Charge Nurse entered.  "I can sit with her for a while".    Follow Up Recommendations  SNF     Equipment Recommendations       Recommendations for Other Services       Precautions / Restrictions Precautions Precautions: Fall Restrictions Weight Bearing Restrictions: No    Mobility  Bed Mobility Overal bed mobility: Needs Assistance Bed Mobility: Supine to Sit;Sit to Supine     Supine to sit: Min assist Sit to supine: Mod assist   General bed mobility comments: increased assist to bring B LE's up onto bed   (Generalized weakness)  Transfers Overall transfer level: Needs assistance Equipment used: Rolling walker (2 wheeled) Transfers: Sit to/from [facility] Sit to Stand: Min assist;Mod assist Stand pivot transfers: Min assist;Mod assist        General transfer comment: very unsteady    extra assist to complete turn to toilet/bed  Ambulation/Gait Ambulation/Gait assistance: Mod assist Ambulation Distance (Feet): 16 Feet(8 feet x 2 to and from bathroom) Assistive device: Rolling walker (2 wheeled) Gait Pattern/deviations: Step-to pattern;Drifts right/left;Trunk flexed;Shuffle     General Gait Details: very unsteady gait with poor self correction balance and poor functional use of walker.  Required assist to maneuver around bed and thru bathroom door.  Staggering.  HIGH FALL RISK   Stairs            Wheelchair Mobility    Modified Rankin (Stroke Patients Only)       Balance                                            Cognition Arousal/Alertness: Awake/alert Behavior During Therapy: Restless   Area of Impairment: Orientation;Attention;Memory;Safety/judgement                               General Comments: "I got to go home" pt restless wanting to get dressed and call her husband to come pick her up.       Exercises      General Comments        Pertinent Vitals/Pain Pain Location: generalized pain     Home Living  Prior Function            PT Goals (current goals can now be found in the care plan section) Progress towards PT goals: Progressing toward goals    Frequency    Min 2X/week      PT Plan Current plan remains appropriate    Co-evaluation              AM-PAC PT "6 Clicks" Daily Activity  Outcome Measure  Difficulty turning over in bed (including adjusting bedclothes, sheets and blankets)?: Unable Difficulty moving from lying on back to sitting on the side of the bed? : Unable Difficulty sitting down on and standing up from a chair with arms (e.g., wheelchair, bedside commode, etc,.)?: Unable Help needed moving to and from a bed to chair (including a wheelchair)?: A Lot Help needed walking in hospital room?: A  Lot Help needed climbing 3-5 steps with a railing? : Total 6 Click Score: 8    End of Session Equipment Utilized During Treatment: Gait belt Activity Tolerance: Other (comment)(pt increasingly restless after session, Charge Nurse came in "I can sit with her for a while") Patient left: in bed;with bed alarm set;with nursing/sitter in room;with call bell/phone within reach   PT Visit Diagnosis: Unsteadiness on feet (R26.81);Muscle weakness (generalized) (M62.81);Difficulty in walking, not elsewhere classified (R26.2);Adult, failure to thrive (R62.7)     Time: 8280-0349 PT Time Calculation (min) (ACUTE ONLY): 28 min  Charges:  $Gait Training: 8-22 mins $Therapeutic Activity: 8-22 mins                    G Codes:       Rica Koyanagi  PTA [facility]  Acute  Rehab Pager      [phone]

## 2017-09-11 NOTE — Progress Notes (Signed)
PROGRESS NOTE    Kelsey Hebert  JSE:[id number] DOB: 01-Sep-1935 DOA: 09/16/2017 PCP: Orlena Sheldon, PA-C   Outpatient Specialists:    Brief Narrative:    Kelsey Hebert  is a 82 y.o. female, with past medical history of anemia of chronic disease, diverticulosis, hypertension, DVT was on Xarelto but has stopped it several years ago who lives with her husband and has been brought in by family members because she has not been eating or drinking well for several months, losing weight up to 30-40 pounds in the last 2 months and now getting extremely weak and unable to get out of the bed without help.  Patient surprisingly has no subjective complaints except poor appetite, generalized weakness, inability to get out of the bed without support, she denies any headache, no chest or abdominal pain, no blood in stool or urine, no dysuria or focal weakness.  She denies any history of malignancy.  Has not had physician follow-up in several months to years as they do not trust the assigned PA for medical care.  Found to have hypernatremia and once patient began eating again, developed elevated AST/ALT.      Assessment & Plan:   Principal Problem:   Hypernatremia Active Problems:   Essential hypertension   Normocytic anemia   Severe protein-calorie malnutrition (HCC)   S/P total hip arthroplasty   Failure to thrive with severe dehydration and malnutrition severe -IVF- D5W for hypernatremia -poor intake for long time it seems, does not appear to be fear of food with mesenteric ischemia etc -? Malignancy but not sure what kind-- will repeat CT Scan of abd/pelvis WITH AND WO CONTRAST -remeron QHS -B12 markedly elevated (? Cirrhosis) -TSH elevated mildly- with normal fT4 -monitor Mg/Ph with refeeding syndrome- replete as needed -nutrition consult  Hypernatremia due to poor intake -IVF- D5W -monitor closely  elevated LFTs with normal bilirubin -no obvious causes on CT scan but limited  with no contrast -RUQ : 1. Uncomplicated cholelithiasis. 2. Echogenic liver parenchyma with area of focal fatty sparing suspected measuring 4.2 x 3.3 x 2.9 cm. A subtle underlying hepatic lesion is believed less likely but cannot be entirely excluded. Consider nonemergent cross-sectional imaging without and with IV contrast using a liver lesion protocol for better assessment. 3. Slight undulating surface contour of the liver morphologically compatible with cirrhosis. Small volume of ascites. -? From re-feeding syndrome -CT scan with contrast -may need GI consult if continues to rise  Bradycardia -improving with nutrition  Cirrhosis -never a drinker -viral hepatitis panel normal -monitor daily  Dementia -no behavioral issues  Palliative care consult-- suspect a malignancy driving above issues    DVT prophylaxis:  SCD's  Code Status: DNR   Family Communication: Husband at bedside  Disposition Plan:     Consultants:    Subjective: Patient eating well this aM per husband  Objective: Vitals:   09/10/17 1700 09/10/17 2100 09/11/17 0539 09/11/17 1013  BP:  112/72 110/62 104/68  Pulse:  (!) 106 (!) 56 60  Resp:  16 16   Temp:  98.1 F (36.7 C)  98.6 F (37 C)  TempSrc:  Oral  Oral  SpO2:  92% 100% 100%  Weight: 36.7 kg (81 lb)     Height: 5\' 4"  (1.626 m)       Intake/Output Summary (Last 24 hours) at 09/11/2017 1309 Last data filed at 09/10/2017 1829 Gross per 24 hour  Intake 6697.5 ml  Output -  Net 6697.5 ml   [employer]  09/10/17 1700  Weight: 36.7 kg (81 lb)    Examination:  General exam: poor hygiene, wearing flu mask over hair Respiratory system: no wheezing Cardiovascular system: rrr Gastrointestinal system: +BS, non-tender, has lumps on upper abd and scar down middle Central nervous system: alert and cooeprative      Data Reviewed: I have personally reviewed following labs and imaging studies  CBC: Recent Labs  Lab  08/29/2017 1249 09/09/17 0423 09/11/17 0410  WBC 4.3 3.6* 5.1  NEUTROABS 3.4  --   --   HGB 12.9 12.2 13.5  HCT 40.2 38.7 40.3  MCV 111.0* 110.6* 107.2*  PLT 90* 67* 63*   Basic Metabolic Panel: Recent Labs  Lab 09/09/17 0423 09/09/17 1235 09/09/17 2046 09/10/17 0357 09/11/17 0410  NA 158* 159* 159* 157* 148*  K 3.9 3.9 3.7 3.8 3.3*  CL 124* 124* 125* 122* 115*  CO2 28 28 26 27 26   GLUCOSE 86 69 75 70 158*  BUN 82* 87* 86* 81* 66*  CREATININE 1.04* 0.90 0.88 0.91 0.92  CALCIUM 8.2* 8.2* 8.0* 8.1* 8.1*  MG 2.6*  --   --   --  2.4  PHOS 3.7  --   --   --  1.7*   GFR: Estimated Creatinine Clearance: 28.3 mL/min (by C-G formula based on SCr of 0.92 mg/dL). Liver Function Tests: Recent Labs  Lab 08/30/2017 1258 09/09/17 0423 09/10/17 0357 09/11/17 0410  AST 281* 215* 486* 820*  ALT 228* 197* 310* 504*  ALKPHOS 191* 171* 212* 302*  BILITOT 0.6 0.4 0.5 0.4  PROT 5.8* 5.6* 5.3* 5.7*  ALBUMIN 2.8* 2.7* 2.6* 2.6*   No results for input(s): LIPASE, AMYLASE in the last 168 hours. No results for input(s): AMMONIA in the last 168 hours. Coagulation Profile: No results for input(s): INR, PROTIME in the last 168 hours. Cardiac Enzymes: Recent Labs  Lab 09/09/17 0423  CKTOTAL 78   BNP (last 3 results) No results for input(s): PROBNP in the last 8760 hours. HbA1C: No results for input(s): HGBA1C in the last 72 hours. CBG: No results for input(s): GLUCAP in the last 168 hours. Lipid Profile: No results for input(s): CHOL, HDL, LDLCALC, TRIG, CHOLHDL, LDLDIRECT in the last 72 hours. Thyroid Function Tests: Recent Labs    09/11/2017 1711 09/09/17 1235  TSH 6.223*  --   FREET4  --  0.83   Anemia Panel: Recent Labs    09/09/17 1235  VITAMINB12 4,295*   Urine analysis:    Component Value Date/Time   COLORURINE YELLOW 09/10/2017 0108   APPEARANCEUR CLEAR 09/10/2017 0108   LABSPEC 1.021 09/10/2017 0108   PHURINE 5.0 09/10/2017 0108   GLUCOSEU NEGATIVE 09/10/2017  0108   HGBUR NEGATIVE 09/10/2017 0108   BILIRUBINUR NEGATIVE 09/10/2017 0108   KETONESUR NEGATIVE 09/10/2017 0108   PROTEINUR NEGATIVE 09/10/2017 0108   NITRITE POSITIVE (A) 09/10/2017 0108   LEUKOCYTESUR SMALL (A) 09/10/2017 0108     ) Recent Results (from the past 240 hour(s))  Urine culture     Status: Abnormal   Collection Time: 09/10/17  1:08 AM  Result Value Ref Range Status   Specimen Description   Final    URINE, RANDOM Performed at [facility], [place] [address], [place], [place] [address]    Special Requests   Final    NONE Performed at [facility], [place] [address], [place], [place] [address]    Culture MULTIPLE SPECIES PRESENT, SUGGEST RECOLLECTION (A)  Final   Report Status  09/11/2017 FINAL  Final      Anti-infectives (From admission, onward)   Start     Dose/Rate Route Frequency Ordered Stop   09/10/17 1100  cefTRIAXone (ROCEPHIN) 1 g in sodium chloride 0.9 % 100 mL IVPB     1 g 200 mL/hr over 30 Minutes Intravenous Every 24 hours 09/10/17 1025         Radiology Studies: US Abdomen Limited Ruq  Result Date: 09/09/2017 CLINICAL DATA:  Anorexia EXAM: ULTRASOUND ABDOMEN LIMITED RIGHT UPPER QUADRANT COMPARISON:  Noncontrast CT from 09/13/2017. FINDINGS: Gallbladder: Numerous layering gallstones are noted without secondary signs of acute cholecystitis. The largest gallstone was approximately 8 mm. No pericholecystic fluid or mural thickening of the gallbladder is noted. No sonographic Murphy sign noted by sonographer. Common bile duct: Diameter: 3.3 mm and normal. Liver: Small amount of ascites is noted about the liver. Morphologic appearance of cirrhosis. The liver parenchyma is predominantly echogenic with area of hypoechogenicity anteriorly measuring 4.2 x 3.3 x 2.9 cm that is more likely an area focal fatty sparing as opposed to an underlying lesion. CT with without and with IV contrast may help for better assessment. Trace  intrahepatic biliary dilatation. IMPRESSION: 1. Uncomplicated cholelithiasis. 2. Echogenic liver parenchyma with area of focal fatty sparing suspected measuring 4.2 x 3.3 x 2.9 cm. A subtle underlying hepatic lesion is believed less likely but cannot be entirely excluded. Consider nonemergent cross-sectional imaging without and with IV contrast using a liver lesion protocol for better assessment. 3. Slight undulating surface contour of the liver morphologically compatible with cirrhosis. Small volume of ascites. Electronically Signed   By: Ashley Royalty M.D.   On: 09/09/2017 14:57        Scheduled Meds: . feeding supplement (ENSURE ENLIVE)  237 mL Oral TID BM  . feeding supplement (PRO-STAT SUGAR FREE 64)  30 mL Oral TID WC  . Influenza vac split quadrivalent PF  0.5 mL Intramuscular Tomorrow-1000  . mirtazapine  15 mg Oral QHS  . multivitamin  15 mL Oral Daily  . nystatin  5 mL Oral QID  . potassium phosphate (monobasic)  500 mg Oral TID WC & HS   Continuous Infusions: . cefTRIAXone (ROCEPHIN)  IV 1 g (09/11/17 1100)  . dextrose 50 mL/hr at 09/11/17 1221  . lactated ringers 100 mL/hr at 09/09/17 0821     LOS: 3 days    Time spent: 35 min    Geradine Girt, DO [facility] Pager [phone]  If 7PM-7AM, please contact night-coverage [email or web address] Password [email or web address] 09/11/2017, 1:09 PM
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
## 2017-09-11 NOTE — Progress Notes (Signed)
Calorie Count Note  48 hour calorie count ordered. Day 1 results below. Diet: Soft diet Supplements:  -Ensure Enlive po TID, each supplement provides 350 kcal and 20 grams of protein -Prostat liquid protein PO 30 ml BID with meals, each supplement provides 100 kcal, 15 grams protein.  2/19-2/20 Breakfast (2/20): 153 kcal, 2g protein Lunch: 175 kcal, 7g protein Dinner: 0% Supplements: 700 kcal, 40g protein  Estimated Nutritional Needs:  Kcal:  1350-1550 Protein:  65-75g  Total intake: 1028 kcal (76% of minimum estimated needs)   49g protein (75% of minimum estimated needs)  Nutrition SN:[id number] Malnutrition related to chronic illness(severe dehydration, poor appetite) as evidenced by energy intake < or equal to 75% for > or equal to 1 month, severe fat depletion, severe muscle depletion.  Goal: Pt to meet >/= 90% of their estimated nutrition needs   Intervention:  -Continue supplements ordered -Continue Calorie Count  Clayton Bibles, MS, RD, LDN [place] Dietitian Pager: [phone] After Hours Pager: [phone]

## 2017-09-11 NOTE — Care Management Important Message (Signed)
Important Message  Patient Details  Name: Kelsey Hebert MRN: [id number] Date of Birth: [birth year]   Medicare Important Message Given:  Yes    Kerin Salen 09/11/2017, 12:58 [place] Message  Patient Details  Name: Kelsey Hebert MRN: [id number] Date of Birth: 05/17/1936   Medicare Important Message Given:  Yes    Kerin Salen 09/11/2017, 12:58 PM

## 2017-09-11 NOTE — Progress Notes (Signed)
Initial Nutrition Assessment  DOCUMENTATION CODES:   Severe malnutrition in context of chronic illness, Underweight  INTERVENTION:   Monitor magnesium, potassium, and phosphorus daily for at least 3 days, MD to replete as needed, as pt is at risk for refeeding syndrome given severe malnutrition, poor PO intakes > 1 month and low K, Phos levels.  -Continue Ensure Enlive po TID, each supplement provides 350 kcal and 20 grams of protein -Continue Prostat liquid protein PO 30 ml BID with meals, each supplement provides 100 kcal, 15 grams protein. -Continue Multivitamin daily -Encouraged PO intakes  If pt unable to increase PO intakes: Recommend consideration of feeding tube placement. Recommend Osmolite 1.5 @ 40 ml/hr as goal rate.  NUTRITION DIAGNOSIS:   Severe Malnutrition related to chronic illness(severe dehydration, poor appetite) as evidenced by energy intake < or equal to 75% for > or equal to 1 month, severe fat depletion, severe muscle depletion.  GOAL:   Patient will meet greater than or equal to 90% of their needs  MONITOR:   PO intake, Supplement acceptance, Weight trends, Labs, Skin, I & O's  REASON FOR ASSESSMENT:   Consult Assessment of nutrition requirement/status, Calorie Count  ASSESSMENT:   82 y.o female admitted because she has not been eating or drinking well for several months, losing weight up to 30-40 pounds in the last 2 months and now getting extremely weak and unable to get out of the bed without help.  Pt in room with nursing staff and husband at bedside. Pt not able to provide clear history of recent intakes. Breakfast sitting in room, mostly untouched. Pt states she wants to eat better. Likes Ensure supplements and was trying to drink 2-3 Ensure Plus supplements a day at home. Per pt's husband, pt had a hip replacement (2017) then went to rehab and when she went home her PO intake started to decrease gradually. At the time of her hip replacement, pt  weighed ~138 lb. Pt was unable to tell me what she has weighed over the past year.  Calorie Count in progress, see separate note.   At time if visit, staff along with [place] nurse preparing to do a skin assessment. Will monitor for results.   Given severe malnutrition and desire for full code, pt would benefit from nutritional support.  Medications: K-DUR tablet once, D5 solution @ 50 ml/hr -provides 204 kcal Labs reviewed: Elevated Na Low K, Phos Mg WNL  NUTRITION - FOCUSED PHYSICAL EXAM:    Most Recent Value  Orbital Region  Severe depletion  Upper Arm Region  Severe depletion  Thoracic and Lumbar Region  Unable to assess  Buccal Region  Severe depletion  Temple Region  Severe depletion  Clavicle Bone Region  Severe depletion  Clavicle and Acromion Bone Region  Severe depletion  Scapular Bone Region  Severe depletion  Dorsal Hand  Severe depletion  Patellar Region  Unable to assess  Anterior Thigh Region  Unable to assess  Posterior Calf Region  Unable to assess  Edema (RD Assessment)  None  Skin  Reviewed [dry]       Diet Order:  DIET SOFT Room service appropriate? Yes; Fluid consistency: Thin  EDUCATION NEEDS:   Education needs have been addressed  Skin:  Skin Assessment: Reviewed RN Assessment  Last BM:  2/16  Height:   Ht Readings from Last 1 Encounters:  09/10/17 5\' 4"  (1.626 m)    Weight:   Wt Readings from Last 1 Encounters:  09/10/17 81 lb (36.7 kg)  Ideal Body Weight:  54.5 kg  BMI:  Body mass index is 13.9 kg/m.  Estimated Nutritional Needs:   Kcal:  1350-1550  Protein:  65-75g  Fluid:  1.5L/day  Clayton Bibles, MS, RD, LDN [place] Dietitian Pager: [phone] After Hours Pager: [phone]

## 2017-09-12 DIAGNOSIS — K7469 Other cirrhosis of liver: Secondary | ICD-10-CM

## 2017-09-12 LAB — CBC
HCT: 42.9 % (ref 36.0–46.0)
HEMOGLOBIN: 14.5 g/dL (ref 12.0–15.0)
MCH: 35.6 pg — ABNORMAL HIGH (ref 26.0–34.0)
MCHC: 33.8 g/dL (ref 30.0–36.0)
MCV: 105.4 fL — ABNORMAL HIGH (ref 78.0–100.0)
Platelets: 75 10*3/uL — ABNORMAL LOW (ref 150–400)
RBC: 4.07 MIL/uL (ref 3.87–5.11)
RDW: 13.9 % (ref 11.5–15.5)
WBC: 5.4 10*3/uL (ref 4.0–10.5)

## 2017-09-12 LAB — COMPREHENSIVE METABOLIC PANEL
ALT: 431 U/L — ABNORMAL HIGH (ref 14–54)
AST: 504 U/L — AB (ref 15–41)
Albumin: 2.7 g/dL — ABNORMAL LOW (ref 3.5–5.0)
Alkaline Phosphatase: 282 U/L — ABNORMAL HIGH (ref 38–126)
Anion gap: 8 (ref 5–15)
BUN: 51 mg/dL — ABNORMAL HIGH (ref 6–20)
CHLORIDE: 114 mmol/L — AB (ref 101–111)
CO2: 25 mmol/L (ref 22–32)
Calcium: 8.5 mg/dL — ABNORMAL LOW (ref 8.9–10.3)
Creatinine, Ser: 0.83 mg/dL (ref 0.44–1.00)
Glucose, Bld: 96 mg/dL (ref 65–99)
POTASSIUM: 4.3 mmol/L (ref 3.5–5.1)
SODIUM: 147 mmol/L — AB (ref 135–145)
Total Bilirubin: 0.5 mg/dL (ref 0.3–1.2)
Total Protein: 5.9 g/dL — ABNORMAL LOW (ref 6.5–8.1)

## 2017-09-12 LAB — PROTIME-INR
INR: 1.15
PROTHROMBIN TIME: 14.6 s (ref 11.4–15.2)

## 2017-09-12 LAB — PHOSPHORUS: PHOSPHORUS: 1.9 mg/dL — AB (ref 2.5–4.6)

## 2017-09-12 LAB — AMMONIA: Ammonia: 19 umol/L (ref 9–35)

## 2017-09-12 LAB — MAGNESIUM: MAGNESIUM: 2.3 mg/dL (ref 1.7–2.4)

## 2017-09-12 MED ORDER — BISACODYL 10 MG RE SUPP
10.0000 mg | Freq: Every day | RECTAL | Status: DC | PRN
  Administered 2017-09-12: 10 mg via RECTAL
  Filled 2017-09-12: qty 1

## 2017-09-12 NOTE — Progress Notes (Signed)
PROGRESS NOTE    Kelsey Hebert  UUV:[id number] DOB: [birth year] DOA: 09/19/2017 PCP: Orlena Sheldon, PA-C   Outpatient Specialists:    Brief Narrative:    Kelsey Hebert  is a 82 y.o. female, with past medical history of anemia of chronic disease, diverticulosis, hypertension, DVT was on Xarelto but has stopped it several years ago who lives with her husband and has been brought in by family members because she has not been eating or drinking well for several months, losing weight up to 30-40 pounds in the last 2 months and now getting extremely weak and unable to get out of the bed without help.  Patient surprisingly has no subjective complaints except poor appetite, generalized weakness, inability to get out of the bed without support, she denies any headache, no chest or abdominal pain, no blood in stool or urine, no dysuria or focal weakness.  She denies any history of malignancy.  Has not had physician follow-up in several months to years as they do not trust the assigned PA for medical care.  Found to have hypernatremia and once patient began eating again, developed elevated AST/ALT and repeat CT shows cirrhosis with ascites and pleural effusions.      Assessment & Plan:   Principal Problem:   Hypernatremia Active Problems:   Essential hypertension   Normocytic anemia   Severe protein-calorie malnutrition (HCC)   S/P total hip arthroplasty   Transaminitis   Failure to thrive with severe dehydration and malnutrition severe -IVF- D5W for hypernatremia-- Na slowly improving, but unfortunately 3rd spacing -poor intake for long time it seems, does not appear to be fear of food with mesenteric ischemia etc -? Malignancy but not sure what kind--repeated CT Scan of abd/pelvis WITH AND WO CONTRAST but this shows cirrhosis and acities/pleural effusion -remeron QHS -B12 markedly elevated (? Cirrhosis) -TSH elevated mildly- with normal fT4 -monitor Mg/Ph with refeeding syndrome-  replete as needed -nutrition consult  Cirrhosis  -suspect NASH as patient was overweight does have h/o portal vein thrombosis -no h/o alcohol -poor overall prognosis -have spoken with husband. Not sure how much he is processing-- palliative care consult -have not called GI  Hypernatremia due to poor intake -IVF- D5W -monitor closely  elevated LFTs with normal bilirubin -suspect due to refeeding syndrome-- finally trending down -RUQ : 1. Uncomplicated cholelithiasis. 2. Echogenic liver parenchyma with area of focal fatty sparing suspected measuring 4.2 x 3.3 x 2.9 cm. A subtle underlying hepatic lesion is believed less likely but cannot be entirely excluded. Consider nonemergent cross-sectional imaging without and with IV contrast using a liver lesion protocol for better assessment. 3. Slight undulating surface contour of the liver morphologically compatible with cirrhosis. Small volume of ascites.  Constipation -suppositories  Bradycardia -improving with nutrition  Dementia -does well when husband around, tend to get agitated when he is gone  Hypophosphatemia -replete  Palliative care consult-- poor overall prognosis-- do not think husband is understanding     DVT prophylaxis:  SCD's  Code Status: DNR   Family Communication: Husband at bedside  Disposition Plan:  ?hospice   Consultants:    Subjective: Patient sleepier this AM  Objective: Vitals:   09/11/17 1507 09/11/17 2200 09/12/17 0533 09/12/17 1420  BP: 106/70 105/70 106/77 (!) 100/55  Pulse: 74  70 100  Resp: 18 (!) 22 16 18   Temp: 98 F (36.7 C) 98.1 F (36.7 C) 98.6 F (37 C) 98.1 F (36.7 C)  TempSrc: Oral Axillary Oral Oral  SpO2: 100%  100% 96%  Weight:      Height:        Intake/Output Summary (Last 24 hours) at 09/12/2017 1600 Last data filed at 09/12/2017 1500 Gross per 24 hour  Intake 7910.84 ml  Output -  Net 7910.84 ml   Filed Weights   09/10/17 1700  Weight: 36.7  kg (81 lb)    Examination:  General exam: frail ill appearing Respiratory system: diminished, no increased work of breathing Cardiovascular system: rrr Gastrointestinal system: +BS, lumps on abd- NT, scar down middle, mild fluid wave Central nervous system: alert and cooeprative      Data Reviewed: I have personally reviewed following labs and imaging studies  CBC: Recent Labs  Lab 08/27/2017 1249 09/09/17 0423 09/11/17 0410 09/12/17 0414  WBC 4.3 3.6* 5.1 5.4  NEUTROABS 3.4  --   --   --   HGB 12.9 12.2 13.5 14.5  HCT 40.2 38.7 40.3 42.9  MCV 111.0* 110.6* 107.2* 105.4*  PLT 90* 67* 63* 75*   Basic Metabolic Panel: Recent Labs  Lab 09/09/17 0423 09/09/17 1235 09/09/17 2046 09/10/17 0357 09/11/17 0410 09/12/17 0414  NA 158* 159* 159* 157* 148* 147*  K 3.9 3.9 3.7 3.8 3.3* 4.3  CL 124* 124* 125* 122* 115* 114*  CO2 28 28 26 27 26 25   GLUCOSE 86 69 75 70 158* 96  BUN 82* 87* 86* 81* 66* 51*  CREATININE 1.04* 0.90 0.88 0.91 0.92 0.83  CALCIUM 8.2* 8.2* 8.0* 8.1* 8.1* 8.5*  MG 2.6*  --   --   --  2.4 2.3  PHOS 3.7  --   --   --  1.7* 1.9*   GFR: Estimated Creatinine Clearance: 31.3 mL/min (by C-G formula based on SCr of 0.83 mg/dL). Liver Function Tests: Recent Labs  Lab 09/06/2017 1258 09/09/17 0423 09/10/17 0357 09/11/17 0410 09/12/17 0414  AST 281* 215* 486* 820* 504*  ALT 228* 197* 310* 504* 431*  ALKPHOS 191* 171* 212* 302* 282*  BILITOT 0.6 0.4 0.5 0.4 0.5  PROT 5.8* 5.6* 5.3* 5.7* 5.9*  ALBUMIN 2.8* 2.7* 2.6* 2.6* 2.7*   No results for input(s): LIPASE, AMYLASE in the last 168 hours. Recent Labs  Lab 09/12/17 1150  AMMONIA 19   Coagulation Profile: Recent Labs  Lab 09/12/17 0414  INR 1.15   Cardiac Enzymes: Recent Labs  Lab 09/09/17 0423  CKTOTAL 78   BNP (last 3 results) No results for input(s): PROBNP in the last 8760 hours. HbA1C: No results for input(s): HGBA1C in the last 72 hours. CBG: No results for input(s): GLUCAP in  the last 168 hours. Lipid Profile: No results for input(s): CHOL, HDL, LDLCALC, TRIG, CHOLHDL, LDLDIRECT in the last 72 hours. Thyroid Function Tests: No results for input(s): TSH, T4TOTAL, FREET4, T3FREE, THYROIDAB in the last 72 hours. Anemia Panel: No results for input(s): VITAMINB12, FOLATE, FERRITIN, TIBC, IRON, RETICCTPCT in the last 72 hours. Urine analysis:    Component Value Date/Time   COLORURINE YELLOW 09/10/2017 0108   APPEARANCEUR CLEAR 09/10/2017 0108   LABSPEC 1.021 09/10/2017 0108   PHURINE 5.0 09/10/2017 0108   GLUCOSEU NEGATIVE 09/10/2017 0108   HGBUR NEGATIVE 09/10/2017 0108   BILIRUBINUR NEGATIVE 09/10/2017 0108   KETONESUR NEGATIVE 09/10/2017 0108   PROTEINUR NEGATIVE 09/10/2017 0108   NITRITE POSITIVE (A) 09/10/2017 0108   LEUKOCYTESUR SMALL (A) 09/10/2017 0108     ) Recent Results (from the past 240 hour(s))  Urine culture     Status: Abnormal   Collection  Time: 09/10/17  1:08 AM  Result Value Ref Range Status   Specimen Description   Final    URINE, RANDOM Performed at [facility] [address], [place], [place] [address]    Special Requests   Final    NONE Performed at [facility], [place] [address] [place], [place] [address]    Culture MULTIPLE SPECIES PRESENT, SUGGEST RECOLLECTION (A)  Final   Report Status 09/11/2017 FINAL  Final      Anti-infectives (From admission, onward)   Start     Dose/Rate Route Frequency Ordered Stop   09/10/17 1100  cefTRIAXone (ROCEPHIN) 1 g in sodium chloride 0.9 % 100 mL IVPB  Status:  Discontinued     1 g 200 mL/hr over 30 Minutes Intravenous Every 24 hours 09/10/17 1025 09/11/17 1315       Radiology Studies: Ct Abdomen Pelvis W Wo Contrast  Result Date: 09/11/2017 CLINICAL DATA:  Unintentional weight loss. EXAM: CT ABDOMEN AND PELVIS WITHOUT AND WITH CONTRAST TECHNIQUE: Multidetector CT imaging of the abdomen and pelvis was performed following the standard protocol  before and following the bolus administration of intravenous contrast. CONTRAST:  30mL ISOVUE-300 IOPAMIDOL (ISOVUE-300) INJECTION 61% COMPARISON:  CT from 09/02/2017 and ultrasound from 09/09/2017 FINDINGS: Lower chest: Moderate bilateral pleural effusions. Hepatobiliary: The liver is cirrhotic with a diffusely nodular contour and hypertrophy of the caudate lobe. There are no suspicious arterial phase enhancing lesions identified suggestive of hepatoma. Multiple small stones are identified within the gallbladder. No biliary dilatation. Pancreas: Unremarkable. No pancreatic ductal dilatation or surrounding inflammatory changes. Spleen: The spleen is normal in size and appearance. Adrenals/Urinary Tract: Normal appearance of the adrenal glands. Several left kidney cysts identified. No hydronephrosis or mass. The urinary bladder is largely obscured by beam hardening artifact from right hip arthroplasty device. Stomach/Bowel: Normal appearance of the stomach. No pathologically dilated loops of small bowel identified. No abnormal dilatation of the colon. Diffuse colonic diverticulosis identified. No acute inflammation. Large stool burden identified within the rectum. Vascular/Lymphatic: Aortic atherosclerosis. No aneurysm. The portal vein and hepatic veins appear patent. Small gastric and distal esophageal varices suspected. No adenopathy within the abdomen or pelvis. Reproductive: Poorly visualized due to beam hardening artifact are multiple calcified fibroids within the pelvis. No adnexal mass identified. Other: New large volume of ascites identified compared with previous exam.Previous mesh repair of ventral abdominal wall hernias identified. Evidence of hernia recurrence within the ventral lower abdominal wall is identified which contains nonobstructed loops of small and large bowel, image 55 of series 9. Musculoskeletal: Degenerative disc disease noted within the thoracic and lumbar spine. First degree  anterolisthesis of L4 on L5 noted. IMPRESSION: 1. Morphologic features of the liver compatible with cirrhosis. 2. New large volume of ascites within the abdomen and pelvis. 3. Small distal esophageal and gastric varices suspected. 4. Gallstones 5.  Aortic Atherosclerosis (ICD10-I70.0). 6. Ventral abdominal wall hernia recurrence status post mesh repair. This contains nonobstructed loops of small and large bowel. 7. Bilateral pleural effusions. Electronically Signed   By: Kerby Moors M.D.   On: 09/11/2017 16:56        Scheduled Meds: . feeding supplement (ENSURE ENLIVE)  237 mL Oral TID BM  . feeding supplement (PRO-STAT SUGAR FREE 64)  30 mL Oral TID WC  . Influenza vac split quadrivalent PF  0.5 mL Intramuscular Tomorrow-1000  . mirtazapine  15 mg Oral QHS  . multivitamin  15 mL Oral Daily  . potassium phosphate (monobasic)  500 mg Oral TID WC & HS   Continuous Infusions: . dextrose 50 mL/hr at 09/11/17 1506  . lactated ringers 100 mL/hr at 09/09/17 0821     LOS: 4 days    Time spent: 35 min    Geradine Girt, DO [facility] Pager [phone]  If 7PM-7AM, please contact night-coverage [email or web address] Password [facility] 09/12/2017, 4:00 PM

## 2017-09-12 NOTE — Progress Notes (Signed)
Calorie Count Note  48 hour calorie count ordered. Day 2 results below. Diet: Soft diet Supplements:  -Ensure Enlive po TID, each supplement provides 350 kcal and 20 grams of protein -Prostat liquid protein PO 30 ml BID with meals, each supplement provides 100 kcal, 15 grams protein.  2/20 -2/21: Breakfast (2/21):0% Lunch: 10% -minimal amount Dinner: 0% Supplements: 700 kcal, 40g protein -per documentation  Estimated Nutritional Needs: PYYF:1102-1117 Protein:65-75g  Total intake: 700 kcal (51% of minimum estimated needs)   40g protein (61% of minimum estimated needs)  Nutrition BV:APOLID Malnutritionrelated to chronic illness(severe dehydration, poor appetite)as evidenced by energy intake < or equal to 75% for > or equal to 1 month, severe fat depletion, severe muscle depletion.  Goal: Pt to meet >/= 90% of their estimated nutrition needs   Intervention:  -Continue Ensure supplements -D/c Prostat as pt is refusing  Clayton Bibles, MS, RD, LDN [place] [facility] Pager: [phone] After Hours Pager: [phone]

## 2017-09-13 DIAGNOSIS — Z96649 Presence of unspecified artificial hip joint: Secondary | ICD-10-CM

## 2017-09-13 LAB — COMPREHENSIVE METABOLIC PANEL
ALBUMIN: 2.5 g/dL — AB (ref 3.5–5.0)
ALK PHOS: 237 U/L — AB (ref 38–126)
ALT: 319 U/L — ABNORMAL HIGH (ref 14–54)
AST: 325 U/L — ABNORMAL HIGH (ref 15–41)
Anion gap: 8 (ref 5–15)
BILIRUBIN TOTAL: 0.6 mg/dL (ref 0.3–1.2)
BUN: 39 mg/dL — ABNORMAL HIGH (ref 6–20)
CALCIUM: 8.3 mg/dL — AB (ref 8.9–10.3)
CO2: 26 mmol/L (ref 22–32)
Chloride: 113 mmol/L — ABNORMAL HIGH (ref 101–111)
Creatinine, Ser: 0.65 mg/dL (ref 0.44–1.00)
GFR calc Af Amer: >60 mL/min (ref >60)
GFR calc non Af Amer: >60 mL/min (ref >60)
GLUCOSE: 67 mg/dL (ref 65–99)
Potassium: 3.9 mmol/L (ref 3.5–5.1)
SODIUM: 147 mmol/L — AB (ref 135–145)
TOTAL PROTEIN: 5.5 g/dL — AB (ref 6.5–8.1)

## 2017-09-13 LAB — CBC
HCT: 40.1 % (ref 36.0–46.0)
Hemoglobin: 13.2 g/dL (ref 12.0–15.0)
MCH: 34.9 pg — AB (ref 26.0–34.0)
MCHC: 32.9 g/dL (ref 30.0–36.0)
MCV: 106.1 fL — ABNORMAL HIGH (ref 78.0–100.0)
PLATELETS: 57 10*3/uL — AB (ref 150–400)
RBC: 3.78 MIL/uL — ABNORMAL LOW (ref 3.87–5.11)
RDW: 14.1 % (ref 11.5–15.5)
WBC: 4.7 10*3/uL (ref 4.0–10.5)

## 2017-09-13 NOTE — Progress Notes (Signed)
PROGRESS NOTE    Kelsey Hebert  MWU:[id number] DOB: [birth year] DOA: 09/14/2017 PCP: Rennis Golden    Brief Narrative:  82 year old female who presented with failure to thrive.  She does have a significant past medical history of anemia chronic disease, diverticulosis, hypertension, and history of deep vein thrombosis.  Patient had a progressive and significant decline on her functional physical capacity for the last 2 months prior to hospitalization, on the day of admission she was extremely weak, unable to get out of her bed without help.  On her initial physical examination blood pressure 120/75, heart rate 51, temperature 97.6, oxygen saturation 99%.  Dry mucous membranes, lungs clear to auscultation bilaterally, heart S1-S2 present rhythmic, the abdomen was soft nontender, no lower extremity edema.  Sodium 160, potassium 4.3, chloride 124, bicarb 30, glucose 88, BUN 97, creatinine 1.20, AST 281, ALT 228, total bilirubin 0.6, white count 4.3, hemoglobin 12.9, hematocrit 40.2, platelets 90, serum osmolality 354, urinalysis 6-30 white cells, specific gravity 1.021.  CT of the abdomen showed cirrhosis of the liver, large volume ascites, small distal esophageal and gastric varices, gallstones, ventral abdominal hernia.  Chest x-ray with hyperinflation, no infiltrates.  EKG sinus bradycardia, 56 bpm, poor R wave progression, extremely low voltage.   Patient was admitted to the hospital can diagnosis of severe hypernatremia due to dehydration in the setting of chronic liver disease.    Assessment & Plan:   Principal Problem:   Hypernatremia Active Problems:   Essential hypertension   Normocytic anemia   Severe protein-calorie malnutrition (HCC)   S/P total hip arthroplasty   Transaminitis   1.  Severe hypernatremia due to dehydration. Will continue hydration with D5W at 50 ml per hour, to continue slow correction of serum Na. Today at 147, renal function preserved with serum cr at 0.65,  K at 3.9, will follow on renal panel in am, avoid hypotension or nephrotoxic medications.   2.  Liver cirrhosis. Patient is awake and alert, confused and disorientated, no asterixis, no abdominal pain. INR at 1,15. Will continue conservative management.   3.  Bradycardia. Will continue telemetry monitoring, avoid AV blockers.   4.  Dementia. Suspected reached her baseline, will need to talk to her care givers. Continue mirtazapine. Neuro checks per unit protocol.    DVT prophylaxis:enoxaparin  Code Status: dnr Family Communication:  No family at the bedside Disposition Plan: home   Consultants:     Procedures:     Antimicrobials:       Subjective: Patient is confused and disorientated, no nausea or vomiting, tolerating po well, out of the bed to the chair with physical therapy.   Objective: Vitals:   09/12/17 0533 09/12/17 1420 09/12/17 2136 09/13/17 0414  BP: 106/77 (!) 100/55 117/71 121/67  Pulse: 70 100 68 73  Resp: 16 18 16 16   Temp: 98.6 F (37 C) 98.1 F (36.7 C) 97.9 F (36.6 C) 98.2 F (36.8 C)  TempSrc: Oral Oral Oral Oral  SpO2: 100% 96% 90% 92%  Weight:      Height:        Intake/Output Summary (Last 24 hours) at 09/13/2017 1010 Last data filed at 09/12/2017 1752 Gross per 24 hour  Intake 7900.84 ml  Output -  Net 7900.84 ml   Filed Weights   09/10/17 1700  Weight: 36.7 kg (81 lb)    Examination:   General: Not in pain or dyspnea, deconditioned Neurology: Awake but confused and disorientated, non focal  E ENT: mild  pallor, no icterus, oral mucosa moist Cardiovascular: No JVD. S1-S2 present, rhythmic, no gallops, rubs, or murmurs. No lower extremity edema. Pulmonary: vesicular breath sounds bilaterally, adequate air movement, no wheezing, rhonchi or rales. Gastrointestinal. Abdomen flat, no organomegaly, non tender, no rebound or guarding Skin. No rashes Musculoskeletal: no joint deformities     Data Reviewed: I have personally  reviewed following labs and imaging studies  CBC: Recent Labs  Lab 09/16/2017 1249 09/09/17 0423 09/11/17 0410 09/12/17 0414 09/13/17 0338  WBC 4.3 3.6* 5.1 5.4 4.7  NEUTROABS 3.4  --   --   --   --   HGB 12.9 12.2 13.5 14.5 13.2  HCT 40.2 38.7 40.3 42.9 40.1  MCV 111.0* 110.6* 107.2* 105.4* 106.1*  PLT 90* 67* 63* 75* 57*   Basic Metabolic Panel: Recent Labs  Lab 09/09/17 0423  09/09/17 2046 09/10/17 0357 09/11/17 0410 09/12/17 0414 09/13/17 0338  NA 158*   < > 159* 157* 148* 147* 147*  K 3.9   < > 3.7 3.8 3.3* 4.3 3.9  CL 124*   < > 125* 122* 115* 114* 113*  CO2 28   < > 26 27 26 25 26   GLUCOSE 86   < > 75 70 158* 96 67  BUN 82*   < > 86* 81* 66* 51* 39*  CREATININE 1.04*   < > 0.88 0.91 0.92 0.83 0.65  CALCIUM 8.2*   < > 8.0* 8.1* 8.1* 8.5* 8.3*  MG 2.6*  --   --   --  2.4 2.3  --   PHOS 3.7  --   --   --  1.7* 1.9*  --    < > = values in this interval not displayed.   GFR: Estimated Creatinine Clearance: 32.5 mL/min (by C-G formula based on SCr of 0.65 mg/dL). Liver Function Tests: Recent Labs  Lab 09/09/17 0423 09/10/17 0357 09/11/17 0410 09/12/17 0414 09/13/17 0338  AST 215* 486* 820* 504* 325*  ALT 197* 310* 504* 431* 319*  ALKPHOS 171* 212* 302* 282* 237*  BILITOT 0.4 0.5 0.4 0.5 0.6  PROT 5.6* 5.3* 5.7* 5.9* 5.5*  ALBUMIN 2.7* 2.6* 2.6* 2.7* 2.5*   No results for input(s): LIPASE, AMYLASE in the last 168 hours. Recent Labs  Lab 09/12/17 1150  AMMONIA 19   Coagulation Profile: Recent Labs  Lab 09/12/17 0414  INR 1.15   Cardiac Enzymes: Recent Labs  Lab 09/09/17 0423  CKTOTAL 78   BNP (last 3 results) No results for input(s): PROBNP in the last 8760 hours. HbA1C: No results for input(s): HGBA1C in the last 72 hours. CBG: No results for input(s): GLUCAP in the last 168 hours. Lipid Profile: No results for input(s): CHOL, HDL, LDLCALC, TRIG, CHOLHDL, LDLDIRECT in the last 72 hours. Thyroid Function Tests: No results for input(s):  TSH, T4TOTAL, FREET4, T3FREE, THYROIDAB in the last 72 hours. Anemia Panel: No results for input(s): VITAMINB12, FOLATE, FERRITIN, TIBC, IRON, RETICCTPCT in the last 72 hours.    Radiology Studies: I have reviewed all of the imaging during this hospital visit personally     Scheduled Meds: . feeding supplement (ENSURE ENLIVE)  237 mL Oral TID BM  . feeding supplement (PRO-STAT SUGAR FREE 64)  30 mL Oral TID WC  . Influenza vac split quadrivalent PF  0.5 mL Intramuscular Tomorrow-1000  . mirtazapine  15 mg Oral QHS  . multivitamin  15 mL Oral Daily  . potassium phosphate (monobasic)  500 mg Oral TID WC & HS  Continuous Infusions: . dextrose 25 mL/hr at 09/12/17 1653  . lactated ringers 100 mL/hr at 09/09/17 0100     LOS: 5 days        Tawni Millers, MD [facility] Pager [phone]

## 2017-09-13 NOTE — Progress Notes (Signed)
Physical Therapy Treatment Patient Details Name: Kelsey Hebert MRN: [id number] DOB: July 17, 1936 Today's Date: 09/13/2017    History of Present Illness 82yo female brought in by family due to not eating or drinking well for several months, weight loss of 30-40#, and increased weakness. Diagnosed with severe dehydration causing hypernatremia, severe cachexia and malnutrition. PMH OA, HTN, malnutrition, osteopenia, hx DVT, hx R anterior hip 2017    PT Comments    Patient progressing to hallway ambulation this session.  Able to request appropriately to toilet and assisted with hygiene.  Continues to be appropriate for SNF level rehab at d/c.  PT to follow acutely.  Follow Up Recommendations  SNF     Equipment Recommendations  None recommended by PT    Recommendations for Other Services       Precautions / Restrictions Precautions Precautions: Fall    Mobility  Bed Mobility Overal bed mobility: Needs Assistance Bed Mobility: Supine to Sit     Supine to sit: Mod assist     General bed mobility comments: assist for both legs to EOB and to lift trunk  Transfers Overall transfer level: Needs assistance Equipment used: Rolling walker (2 wheeled) Transfers: Sit to/from Stand Sit to Stand: Mod assist;+2 physical assistance         General transfer comment: lifting help from EOB and 3:1 over commode with cues for hand placement  Ambulation/Gait Ambulation/Gait assistance: Min assist;+2 safety/equipment;Mod assist Ambulation Distance (Feet): 90 Feet Assistive device: Rolling walker (2 wheeled) Gait Pattern/deviations: Step-through pattern;Decreased stride length;Narrow base of support;Scissoring;Shuffle     General Gait Details: weakness evident in LE's with kicking heels due to scissoring and with shuffling pattern.  mod A at times for turning, or walker safety   Stairs            Wheelchair Mobility    Modified Rankin (Stroke Patients Only)       Balance  Overall balance assessment: Needs assistance Sitting-balance support: Feet supported;No upper extremity supported Sitting balance-Leahy Scale: Fair     Standing balance support: Bilateral upper extremity supported Standing balance-Leahy Scale: Poor Standing balance comment: min A with walker for balance                            Cognition Arousal/Alertness: Awake/alert Behavior During Therapy: WFL for tasks assessed/performed Overall Cognitive Status: Impaired/Different from baseline Area of Impairment: Memory;Attention;Safety/judgement;Orientation                 Orientation Level: Time;Place Current Attention Level: Sustained Memory: Decreased short-term memory   Safety/Judgement: Decreased awareness of safety;Decreased awareness of deficits            Exercises      General Comments        Pertinent Vitals/Pain Faces Pain Scale: Hurts a little bit Pain Location: perineal area with hygiene Pain Descriptors / Indicators: Tender Pain Intervention(s): Monitored during session;Limited activity within patient's tolerance    Home Living                      Prior Function            PT Goals (current goals can now be found in the care plan section) Progress towards PT goals: Progressing toward goals    Frequency    Min 2X/week      PT Plan Current plan remains appropriate    Co-evaluation  AM-PAC PT "6 Clicks" Daily Activity  Outcome Measure  Difficulty turning over in bed (including adjusting bedclothes, sheets and blankets)?: Unable Difficulty moving from lying on back to sitting on the side of the bed? : Unable Difficulty sitting down on and standing up from a chair with arms (e.g., wheelchair, bedside commode, etc,.)?: Unable Help needed moving to and from a bed to chair (including a wheelchair)?: A Lot Help needed walking in hospital room?: A Lot Help needed climbing 3-5 steps with a railing? : Total 6  Click Score: 8    End of Session Equipment Utilized During Treatment: Gait belt   Patient left: with call bell/phone within reach;with chair alarm set;in chair;Other (comment)(NT outside room) Nurse Communication: Mobility status PT Visit Diagnosis: Other abnormalities of gait and mobility (R26.89);Muscle weakness (generalized) (M62.81);Adult, failure to thrive (R62.7)     Time: 6222-9798 PT Time Calculation (min) (ACUTE ONLY): 29 min  Charges:  $Gait Training: 8-22 mins $Therapeutic Activity: 8-22 mins                    G CodesMagda Kiel, [place] [phone] 09/13/2017    Reginia Naas 09/13/2017, 2:00 PM

## 2017-09-14 DIAGNOSIS — K7210 Chronic hepatic failure without coma: Secondary | ICD-10-CM

## 2017-09-14 LAB — BASIC METABOLIC PANEL
Anion gap: 7 (ref 5–15)
BUN: 35 mg/dL — AB (ref 6–20)
CHLORIDE: 109 mmol/L (ref 101–111)
CO2: 26 mmol/L (ref 22–32)
Calcium: 7.9 mg/dL — ABNORMAL LOW (ref 8.9–10.3)
Creatinine, Ser: 0.74 mg/dL (ref 0.44–1.00)
GFR calc non Af Amer: >60 mL/min (ref >60)
Glucose, Bld: 68 mg/dL (ref 65–99)
POTASSIUM: 3.9 mmol/L (ref 3.5–5.1)
SODIUM: 142 mmol/L (ref 135–145)

## 2017-09-14 MED ORDER — HALOPERIDOL 0.5 MG PO TABS: 1.0000 mg | ORAL_TABLET | ORAL | Status: DC | PRN

## 2017-09-14 MED ORDER — HALOPERIDOL LACTATE 5 MG/ML IJ SOLN
1.0000 mg | INTRAMUSCULAR | Status: DC | PRN
  Filled 2017-09-14: qty 1

## 2017-09-14 NOTE — Progress Notes (Signed)
PMT progress note  Patient is confused and combative, she demands for me to take my coat off, she would like to check its pockets to make sure I didn't take her money. Her husband is by her bedside, states that she has been confused since last night.   BP 106/68 (BP Location: Right Arm)   Pulse 70   Temp 98.2 F (36.8 C) (Oral)   Resp 16   Ht 5\' 4"  (1.626 m)   Wt 36.7 kg (81 lb)   SpO2 90%   BMI 13.90 kg/m  Labs and imaging noted.  CT with cirrhosis Has thrombocytopenia Has low serum Albumin  Awake but confused Thin weak frail Appears chronically ill No edema Has muscle wasting Does not allow auscultation, demands to see my stethoscope.  Abdomen appears to have hernia, mild distension evident   PPS 20-30%  A/P: Generalized deconditioning, weight loss, failure to thrive Acute delirium High Ha, low platelets Cirrhosis liver Transaminitis.   Unclear if there is an underlying malignancy. Regardless, the patient has ongoing decline, she appears frail weak, agree with DNR, would recommend home with hospice, how ever, unable to have this conversation with patient/husband this am.  Continue gentle medications, minimize stimulation, offer reassurance and supportive care.  Discussed with TRH MD.  25 minutes spent Loistine Chance MD [facility] health palliative medicine team [phone]

## 2017-09-14 NOTE — Progress Notes (Signed)
PROGRESS NOTE    Kelsey Hebert  JKD:[id number] DOB: [birth year] DOA: 09/03/2017 PCP: Rennis Golden    Brief Narrative:  82 year old female who presented with failure to thrive.  She does have a significant past medical history of anemia chronic disease, diverticulosis, hypertension, and history of deep vein thrombosis.  Patient had a progressive and significant decline on her functional physical capacity for the last 2 months prior to hospitalization, on the day of admission she was extremely weak, unable to get out of her bed without help.  On her initial physical examination blood pressure 120/75, heart rate 51, temperature 97.6, oxygen saturation 99%.  Dry mucous membranes, lungs clear to auscultation bilaterally, heart S1-S2 present rhythmic, the abdomen was soft nontender, no lower extremity edema.  Sodium 160, potassium 4.3, chloride 124, bicarb 30, glucose 88, BUN 97, creatinine 1.20, AST 281, ALT 228, total bilirubin 0.6, white count 4.3, hemoglobin 12.9, hematocrit 40.2, platelets 90, serum osmolality 354, urinalysis 6-30 white cells, specific gravity 1.021.  CT of the abdomen showed cirrhosis of the liver, large volume ascites, small distal esophageal and gastric varices, gallstones, ventral abdominal hernia.  Chest x-ray with hyperinflation, no infiltrates.  EKG sinus bradycardia, 56 bpm, poor R wave progression, extremely low voltage.   Patient was admitted to the hospital can diagnosis of severe hypernatremia due to dehydration in the setting of chronic liver disease.    Assessment & Plan:   Principal Problem:   Hypernatremia Active Problems:   Essential hypertension   Normocytic anemia   Severe protein-calorie malnutrition (HCC)   S/P total hip arthroplasty   Transaminitis  1.  Severe hypernatremia due to dehydration. Tolerating well D5W at 50 ml per hour,serum Na continue to trend down to 142, continue renal function to be preserved with serum cr at 0.74, K at 3.9.  Follow electrolytes in am.  2.  Liver cirrhosis with chronic liver failure. Persistent confusion and disorientation, but no asterixis, no abdominal pain, or coagulopathy. Will continue conservative management for chronic liver failure, will recommend to follow as outpatient. No clinically significant ascites. Patient will benefit from hospice services. Social services consulted. I discussed the case with Dr. Rowe Pavy (Palliative Care).   3.  Bradycardia. Stable bradycardia, blood pressure 106 to 111 mmHg.   4.  Dementia. Looks to be at her baseline, patient's husband at the bedside at the time of my examination. Poor prognosis.   5. Severe calorie protein malnutrition. Will continue nutritional supplements, continue poor oral intake.    DVT prophylaxis:enoxaparin  Code Status: dnr Family Communication:  I spoke with patient's husband at the bedside and all questions were addressed, he is interested in learning more from home hospice.  Disposition Plan: home   Consultants:     Procedures:     Antimicrobials:     Subjective: Patient is confused, but not agitated, no nausea or vomiting, continue to have poor oral intake.   Objective: Vitals:   09/13/17 0414 09/13/17 1452 09/13/17 2112 09/14/17 0538  BP: 121/67 99/68 97/79  106/68  Pulse: 73 72 70   Resp: 16 16 14 16   Temp: 98.2 F (36.8 C)     TempSrc: Oral  Axillary Axillary  SpO2: 92%  99% 90%  Weight:      Height:       No intake or output data in the 24 hours ending 09/14/17 1320 Filed Weights   09/10/17 1700  Weight: 36.7 kg (81 lb)    Examination:   General: Not in  pain or dyspnea, deconditioned Neurology: Awake and alert, non focal  E FWY:OVZC pallor, no icterus, oral mucosa moist Cardiovascular: No JVD. S1-S2 present, rhythmic, no gallops, rubs, or murmurs. No lower extremity edema. Pulmonary: decreased breath sounds bilaterally at bases, adequate air movement, no wheezing, rhonchi or  rales. Gastrointestinal. Abdomen flat, positive ventral hernia, no organomegaly, non tender, no rebound or guarding Skin. No rashes Musculoskeletal: no joint deformities     Data Reviewed: I have personally reviewed following labs and imaging studies  CBC: Recent Labs  Lab 09/04/2017 1249 09/09/17 0423 09/11/17 0410 09/12/17 0414 09/13/17 0338  WBC 4.3 3.6* 5.1 5.4 4.7  NEUTROABS 3.4  --   --   --   --   HGB 12.9 12.2 13.5 14.5 13.2  HCT 40.2 38.7 40.3 42.9 40.1  MCV 111.0* 110.6* 107.2* 105.4* 106.1*  PLT 90* 67* 63* 75* 57*   Basic Metabolic Panel: Recent Labs  Lab 09/09/17 0423  09/10/17 0357 09/11/17 0410 09/12/17 0414 09/13/17 0338 09/14/17 0417  NA 158*   < > 157* 148* 147* 147* 142  K 3.9   < > 3.8 3.3* 4.3 3.9 3.9  CL 124*   < > 122* 115* 114* 113* 109  CO2 28   < > 27 26 25 26 26   GLUCOSE 86   < > 70 158* 96 67 68  BUN 82*   < > 81* 66* 51* 39* 35*  CREATININE 1.04*   < > 0.91 0.92 0.83 0.65 0.74  CALCIUM 8.2*   < > 8.1* 8.1* 8.5* 8.3* 7.9*  MG 2.6*  --   --  2.4 2.3  --   --   PHOS 3.7  --   --  1.7* 1.9*  --   --    < > = values in this interval not displayed.   GFR: Estimated Creatinine Clearance: 32.5 mL/min (by C-G formula based on SCr of 0.74 mg/dL). Liver Function Tests: Recent Labs  Lab 09/09/17 0423 09/10/17 0357 09/11/17 0410 09/12/17 0414 09/13/17 0338  AST 215* 486* 820* 504* 325*  ALT 197* 310* 504* 431* 319*  ALKPHOS 171* 212* 302* 282* 237*  BILITOT 0.4 0.5 0.4 0.5 0.6  PROT 5.6* 5.3* 5.7* 5.9* 5.5*  ALBUMIN 2.7* 2.6* 2.6* 2.7* 2.5*   No results for input(s): LIPASE, AMYLASE in the last 168 hours. Recent Labs  Lab 09/12/17 1150  AMMONIA 19   Coagulation Profile: Recent Labs  Lab 09/12/17 0414  INR 1.15   Cardiac Enzymes: Recent Labs  Lab 09/09/17 0423  CKTOTAL 78   BNP (last 3 results) No results for input(s): PROBNP in the last 8760 hours. HbA1C: No results for input(s): HGBA1C in the last 72 hours. CBG: No  results for input(s): GLUCAP in the last 168 hours. Lipid Profile: No results for input(s): CHOL, HDL, LDLCALC, TRIG, CHOLHDL, LDLDIRECT in the last 72 hours. Thyroid Function Tests: No results for input(s): TSH, T4TOTAL, FREET4, T3FREE, THYROIDAB in the last 72 hours. Anemia Panel: No results for input(s): VITAMINB12, FOLATE, FERRITIN, TIBC, IRON, RETICCTPCT in the last 72 hours.    Radiology Studies: I have reviewed all of the imaging during this hospital visit personally     Scheduled Meds: . feeding supplement (ENSURE ENLIVE)  237 mL Oral TID BM  . feeding supplement (PRO-STAT SUGAR FREE 64)  30 mL Oral TID WC  . mirtazapine  15 mg Oral QHS  . multivitamin  15 mL Oral Daily   Continuous Infusions: . dextrose 75  mL/hr at 09/13/17 1849     LOS: 6 days        Tawni Millers, MD [facility] Pager [phone]

## 2017-09-15 LAB — BASIC METABOLIC PANEL
Anion gap: 10 (ref 5–15)
BUN: 30 mg/dL — AB (ref 6–20)
CHLORIDE: 110 mmol/L (ref 101–111)
CO2: 24 mmol/L (ref 22–32)
CREATININE: 0.62 mg/dL (ref 0.44–1.00)
Calcium: 8.1 mg/dL — ABNORMAL LOW (ref 8.9–10.3)
GFR calc Af Amer: >60 mL/min (ref >60)
GLUCOSE: 62 mg/dL — AB (ref 65–99)
Potassium: 4.4 mmol/L (ref 3.5–5.1)
SODIUM: 144 mmol/L (ref 135–145)

## 2017-09-15 MED ORDER — LACTULOSE 10 GM/15ML PO SOLN
30.0000 g | Freq: Three times a day (TID) | ORAL | Status: DC
  Filled 2017-09-15: qty 45

## 2017-09-15 MED ORDER — HALOPERIDOL LACTATE 5 MG/ML IJ SOLN
2.0000 mg | INTRAMUSCULAR | Status: DC | PRN
  Administered 2017-09-15: 2 mg via INTRAMUSCULAR
  Filled 2017-09-15 (×2): qty 1

## 2017-09-15 MED ORDER — HALOPERIDOL 0.5 MG PO TABS
2.0000 mg | ORAL_TABLET | ORAL | Status: DC | PRN
  Filled 2017-09-15: qty 4

## 2017-09-15 NOTE — Progress Notes (Signed)
PROGRESS NOTE    Kelsey Hebert  YKZ:[id number] DOB: 08/04/1935 DOA: 09/05/2017 PCP: Rennis Golden    Brief Narrative:  82 year old female who presented with failure to thrive. She does have a significant past medical history of anemia of chronic disease, diverticulosis, hypertension, andhistory of deep vein thrombosis.Patient had a progressive and significant decline on her functional physical capacity for the last 2 months prior to hospitalization,on the day of admission she was extremely weak, unable to get out of her bed without help.On her initial physical examination blood pressure 120/75, heart rate 51, temperature 97.6, oxygen saturation 99%. Drymucous membranes, lungs clear to auscultation bilaterally, heart S1-S2 present rhythmic, the abdomen was soft nontender, no lower extremity edema.Sodium 160, potassium 4.3, chloride 124, bicarb 30, glucose 88, BUN 97, creatinine 1.20, AST 281, ALT 228, total bilirubin 0.6, white count 4.3, hemoglobin 12.9, hematocrit 40.2, platelets 90,serum osmolality 354, urinalysis 6-30 white cells, specific gravity 1.021.CT of the abdomen showed cirrhosis of the liver, large volume ascites, small distal esophageal and gastric varices, gallstones, ventral abdominal hernia.Chest x-ray with hyperinflation, no infiltrates. EKG sinus bradycardia, 56 bpm, poor R wave progression, extremely low voltage.  Patient was admitted to the hospital with working diagnosis of severe hypernatremia due to dehydration in the setting of chronic liver disease.   Assessment & Plan:   Principal Problem:   Hypernatremia Active Problems:   Essential hypertension   Normocytic anemia   Severe protein-calorie malnutrition (HCC)   S/P total hip arthroplasty   Transaminitis   1.Severe hypernatremia due to dehydration. Patient has been pulling IV and unable to infuse dextrose with water. Very poor oral intake, this am is confused and not interactive. Will  continue to encourage po intake as tolerated. I spoke with her husband at the bedside and he is interested in meeting with hospice, for possible home hospice.  2.Liver cirrhosis with chronic liver failure. Patient with worsening confusion, not taking po medications today, not responded well to haldol 1 mg, will increase to 2 mg and will continue close neuro checks. Ammonia has not been elevated, patient with no asterixis, will check ammonia in am. Will start lactulose as tolerated today. Patient with very poor prognosis.   3.Bradycardia. Persistent bradycardia, but patient hemodynamically stable.   4.Dementia.Today more confused and disorientated, very poor prognosis. Follow with hospice consultation.    5. Severe calorie protein malnutrition. As tolerated nutritional supplements.    DVT prophylaxis:enoxaparin Code Status:dnr Family Communication:I spoke with patient's husband at the bedside and all questions were addressed, he is interested in learning more from home hospice for possible home hospice services at discharge. .  Disposition Plan:home   Consultants:    Procedures:    Antimicrobials:     Subjective: Patient has been very agitated and confused, has not responded to 1 mg of haldol every 4 hours as needed. Not able to sleep last night, this am all information from patient's husband and nurse at the bedside. Poor oral intake and IV fluids discontinued due to patient's not cooperation.   Objective: Vitals:   09/13/17 2112 09/14/17 0538 09/14/17 1410 09/14/17 2212  BP: 97/79 106/68 111/70 113/69  Pulse: 70  66 60  Resp: 14 16 16 17   Temp:   98.6 F (37 C)   TempSrc: Axillary Axillary Axillary   SpO2: 99% 90% 98% 90%  Weight:      Height:        Intake/Output Summary (Last 24 hours) at 09/15/2017 9390 Last data filed at  09/14/2017 2205 Gross per 24 hour  Intake 120 ml  Output -  Net 120 ml   Filed Weights   09/10/17 1700  Weight:  36.7 kg (81 lb)    Examination:   General: Not in pain or dyspnea, deconditioned Neurology: confused, not interactive, possible hallucinations.  Not following commands, not focal.  E ENT: mild pallor, no icterus, oral mucosa dry Cardiovascular: No JVD. S1-S2 present, rhythmic, no gallops, rubs, or murmurs. No lower extremity edema. Pulmonary: decreased breath sounds bilaterally at bases, poor inspiratory effort, adequate air movement, no wheezing, rhonchi or rales. Gastrointestinal. Abdomen flat, no organomegaly, non tender, no rebound or guarding Skin. No rashes Musculoskeletal: no joint deformities     Data Reviewed: I have personally reviewed following labs and imaging studies  CBC: Recent Labs  Lab 09/01/2017 1249 09/09/17 0423 09/11/17 0410 09/12/17 0414 09/13/17 0338  WBC 4.3 3.6* 5.1 5.4 4.7  NEUTROABS 3.4  --   --   --   --   HGB 12.9 12.2 13.5 14.5 13.2  HCT 40.2 38.7 40.3 42.9 40.1  MCV 111.0* 110.6* 107.2* 105.4* 106.1*  PLT 90* 67* 63* 75* 57*   Basic Metabolic Panel: Recent Labs  Lab 09/09/17 0423  09/11/17 0410 09/12/17 0414 09/13/17 0338 09/14/17 0417 09/15/17 0440  NA 158*   < > 148* 147* 147* 142 144  K 3.9   < > 3.3* 4.3 3.9 3.9 4.4  CL 124*   < > 115* 114* 113* 109 110  CO2 28   < > 26 25 26 26 24   GLUCOSE 86   < > 158* 96 67 68 62*  BUN 82*   < > 66* 51* 39* 35* 30*  CREATININE 1.04*   < > 0.92 0.83 0.65 0.74 0.62  CALCIUM 8.2*   < > 8.1* 8.5* 8.3* 7.9* 8.1*  MG 2.6*  --  2.4 2.3  --   --   --   PHOS 3.7  --  1.7* 1.9*  --   --   --    < > = values in this interval not displayed.   GFR: Estimated Creatinine Clearance: 32.5 mL/min (by C-G formula based on SCr of 0.62 mg/dL). Liver Function Tests: Recent Labs  Lab 09/09/17 0423 09/10/17 0357 09/11/17 0410 09/12/17 0414 09/13/17 0338  AST 215* 486* 820* 504* 325*  ALT 197* 310* 504* 431* 319*  ALKPHOS 171* 212* 302* 282* 237*  BILITOT 0.4 0.5 0.4 0.5 0.6  PROT 5.6* 5.3* 5.7* 5.9*  5.5*  ALBUMIN 2.7* 2.6* 2.6* 2.7* 2.5*   No results for input(s): LIPASE, AMYLASE in the last 168 hours. Recent Labs  Lab 09/12/17 1150  AMMONIA 19   Coagulation Profile: Recent Labs  Lab 09/12/17 0414  INR 1.15   Cardiac Enzymes: Recent Labs  Lab 09/09/17 0423  CKTOTAL 78   BNP (last 3 results) No results for input(s): PROBNP in the last 8760 hours. HbA1C: No results for input(s): HGBA1C in the last 72 hours. CBG: No results for input(s): GLUCAP in the last 168 hours. Lipid Profile: No results for input(s): CHOL, HDL, LDLCALC, TRIG, CHOLHDL, LDLDIRECT in the last 72 hours. Thyroid Function Tests: No results for input(s): TSH, T4TOTAL, FREET4, T3FREE, THYROIDAB in the last 72 hours. Anemia Panel: No results for input(s): VITAMINB12, FOLATE, FERRITIN, TIBC, IRON, RETICCTPCT in the last 72 hours.    Radiology Studies: I have reviewed all of the imaging during this hospital visit personally     Scheduled Meds: .  feeding supplement (ENSURE ENLIVE)  237 mL Oral TID BM  . feeding supplement (PRO-STAT SUGAR FREE 64)  30 mL Oral TID WC  . mirtazapine  15 mg Oral QHS  . multivitamin  15 mL Oral Daily   Continuous Infusions: . dextrose 75 mL/hr at 09/14/17 2205     LOS: 7 days        Tawni Millers, MD [facility] Pager [phone]

## 2017-09-15 NOTE — Progress Notes (Addendum)
Pt refuses medications. She is agitated. Will good if feed.     Haldol 2 mg IM GIVEN FOR AGITATION.     Dr Cathlean Sauer notified that Ms Fulwider will not drink the lactulose. She fights the staff at timed when we try to fed her. He responded To chart that she will not take it

## 2017-09-16 LAB — BASIC METABOLIC PANEL
ANION GAP: 9 (ref 5–15)
BUN: 34 mg/dL — AB (ref 6–20)
CO2: 21 mmol/L — AB (ref 22–32)
Calcium: 7.9 mg/dL — ABNORMAL LOW (ref 8.9–10.3)
Chloride: 112 mmol/L — ABNORMAL HIGH (ref 101–111)
Creatinine, Ser: 0.55 mg/dL (ref 0.44–1.00)
GFR calc Af Amer: >60 mL/min (ref >60)
GFR calc non Af Amer: >60 mL/min (ref >60)
GLUCOSE: 32 mg/dL — AB (ref 65–99)
Potassium: 4 mmol/L (ref 3.5–5.1)
Sodium: 142 mmol/L (ref 135–145)

## 2017-09-16 LAB — AMMONIA: AMMONIA: 30 umol/L (ref 9–35)

## 2017-09-16 LAB — GLUCOSE, CAPILLARY: Glucose-Capillary: 77 mg/dL (ref 65–99)

## 2017-09-16 MED ORDER — DEXTROSE 50 % IV SOLN
1.0000 | Freq: Once | INTRAVENOUS | Status: AC
  Administered 2017-09-16: 50 mL via INTRAVENOUS
  Filled 2017-09-16: qty 50

## 2017-09-16 NOTE — Progress Notes (Addendum)
PROGRESS NOTE    Kelsey Hebert  YOV:[id number] DOB: [birth year] DOA: 09/03/2017 PCP: Orlena Sheldon, PA-C     Brief Narrative:  Kelsey Hebert is 82 year old female who presented with failure to thrive. She does have a significant past medical history of anemia of chronic disease, diverticulosis, hypertension, andhistory of deep vein thrombosis.Patient had a progressive and significant decline on her functional physical capacity for the last 2 months prior to hospitalization;on the day of admission she was extremely weak, unable to get out of her bed without help. She presented with hypernatremia, elevated liver enzymes, and CT abd revealed cirrhosis with large volume ascites, small distal esophageal and gastric varices, gallstones, ventral abdominal hernia.   Assessment & Plan:   Principal Problem:   Hypernatremia Active Problems:   Essential hypertension   Normocytic anemia   Severe protein-calorie malnutrition (HCC)   S/P total hip arthroplasty   Transaminitis   Severe hypernatremia due to dehydration -Hypernatremia resolved   Liver cirrhosiswith chronic liver failure, pancytopenia -Ammonia level is normal -Hepatitis panel negative   Hypoglycemia -Due to poor PO intake, resolved   Dementia -Continues to be disoriented, uncooperative   Severe calorie protein malnutrition -Encourage PO intake, patient has been refusing meals   Acute urinary retention -Foley    DVT prophylaxis: lovenox Code Status: DNR Family Communication: husband at bedside Disposition Plan: pending home hospice conversation   Consultants:   Palliative care  Procedures:   None   Antimicrobials:  Anti-infectives (From admission, onward)   Start     Dose/Rate Route Frequency Ordered Stop   09/10/17 1100  cefTRIAXone (ROCEPHIN) 1 g in sodium chloride 0.9 % 100 mL IVPB  Status:  Discontinued     1 g 200 mL/hr over 30 Minutes Intravenous Every 24 hours 09/10/17 1025 09/11/17 1315         Subjective: Patient confused, unable to obtain meaningful ROS.    Objective: Vitals:   09/15/17 1504 09/15/17 2027 09/16/17 0446 09/16/17 1332  BP: 132/78 134/77 127/64 110/75  Pulse: 70 72 70 78  Resp: 18 12 14 12   Temp: 97.8 F (36.6 C) 98 F (36.7 C) 98.2 F (36.8 C)   TempSrc: Oral Axillary Oral   SpO2: 95% 96% 98% (!) 86%  Weight:      Height:        Intake/Output Summary (Last 24 hours) at 09/16/2017 1453 Last data filed at 09/16/2017 7412 Gross per 24 hour  Intake 0 ml  Output -  Net 0 ml   Filed Weights   09/10/17 1700  Weight: 36.7 kg (81 lb)    Examination:  General exam: Appears calm but confused, grabbing at the air with her hands  Respiratory system: Clear to auscultation. Respiratory effort normal. Cardiovascular system: S1 & S2 heard, RRR. No JVD, murmurs, rubs, gallops or clicks. No pedal edema. Gastrointestinal system: Abdomen is nondistended, soft and nontender. No organomegaly or masses felt. Normal bowel sounds heard. Central nervous system: Alert  Extremities: Symmetric Skin: No rashes, lesions or ulcers Psychiatry: Confused   Data Reviewed: I have personally reviewed following labs and imaging studies  CBC: Recent Labs  Lab 09/11/17 0410 09/12/17 0414 09/13/17 0338  WBC 5.1 5.4 4.7  HGB 13.5 14.5 13.2  HCT 40.3 42.9 40.1  MCV 107.2* 105.4* 106.1*  PLT 63* 75* 57*   Basic Metabolic Panel: Recent Labs  Lab 09/11/17 0410 09/12/17 0414 09/13/17 0338 09/14/17 0417 09/15/17 0440 09/16/17 0353  NA 148* 147* 147* 142 144 142  K 3.3* 4.3 3.9 3.9 4.4 4.0  CL 115* 114* 113* 109 110 112*  CO2 26 25 26 26 24  21*  GLUCOSE 158* 96 67 68 62* 32*  BUN 66* 51* 39* 35* 30* 34*  CREATININE 0.92 0.83 0.65 0.74 0.62 0.55  CALCIUM 8.1* 8.5* 8.3* 7.9* 8.1* 7.9*  MG 2.4 2.3  --   --   --   --   PHOS 1.7* 1.9*  --   --   --   --    GFR: Estimated Creatinine Clearance: 32.5 mL/min (by C-G formula based on SCr of 0.55 mg/dL). Liver  Function Tests: Recent Labs  Lab 09/10/17 0357 09/11/17 0410 09/12/17 0414 09/13/17 0338  AST 486* 820* 504* 325*  ALT 310* 504* 431* 319*  ALKPHOS 212* 302* 282* 237*  BILITOT 0.5 0.4 0.5 0.6  PROT 5.3* 5.7* 5.9* 5.5*  ALBUMIN 2.6* 2.6* 2.7* 2.5*   No results for input(s): LIPASE, AMYLASE in the last 168 hours. Recent Labs  Lab 09/12/17 1150 09/16/17 0353  AMMONIA 19 30   Coagulation Profile: Recent Labs  Lab 09/12/17 0414  INR 1.15   Cardiac Enzymes: No results for input(s): CKTOTAL, CKMB, CKMBINDEX, TROPONINI in the last 168 hours. BNP (last 3 results) No results for input(s): PROBNP in the last 8760 hours. HbA1C: No results for input(s): HGBA1C in the last 72 hours. CBG: Recent Labs  Lab 09/16/17 0839  GLUCAP 77   Lipid Profile: No results for input(s): CHOL, HDL, LDLCALC, TRIG, CHOLHDL, LDLDIRECT in the last 72 hours. Thyroid Function Tests: No results for input(s): TSH, T4TOTAL, FREET4, T3FREE, THYROIDAB in the last 72 hours. Anemia Panel: No results for input(s): VITAMINB12, FOLATE, FERRITIN, TIBC, IRON, RETICCTPCT in the last 72 hours. Sepsis Labs: No results for input(s): PROCALCITON, LATICACIDVEN in the last 168 hours.  Recent Results (from the past 240 hour(s))  Urine culture     Status: Abnormal   Collection Time: 09/10/17  1:08 AM  Result Value Ref Range Status   Specimen Description   Final    URINE, RANDOM Performed at [facility] [address], [place], [place] [address]    Special Requests   Final    NONE Performed at [facility], [place] [address] [place], [place] [address]    Culture MULTIPLE SPECIES PRESENT, SUGGEST RECOLLECTION (A)  Final   Report Status 09/11/2017 FINAL  Final       Radiology Studies: No results found.    Scheduled Meds: . feeding supplement (ENSURE ENLIVE)  237 mL Oral TID BM  . feeding supplement (PRO-STAT SUGAR FREE 64)  30 mL Oral TID WC  . lactulose  30 g Oral  TID  . mirtazapine  15 mg Oral QHS  . multivitamin  15 mL Oral Daily   Continuous Infusions: . dextrose 75 mL/hr at 09/14/17 2205     LOS: 8 days    Time spent: 40 minutes   Dessa Phi, DO [facility] [email or web address] Password [facility] 09/16/2017, 2:53 PM

## 2017-09-16 NOTE — Progress Notes (Signed)
Pt CBG during night shift was found to be 32. RN administered Dextrose IV at 0636. I assumed care of patient at 0730. Follow up CBG taken and found to be 77. Pt stable at this time. Will continue to monitor.

## 2017-09-16 NOTE — Progress Notes (Signed)
[facility] ([facility])  [facility] received request from [place] for family interest in home hospice services after discharge. Chart reviewed and met with spouse. Per spouse, he and son are discussing options. He really prefers to take her home. He is unsure if care needs can be met at home even with [facility] support. Explained services, answered his questions and offered to speak with his son. He accepted brochure and is agreeable to follow up tomorrow. Updated RNCM.   Thank you,  Erling Conte, LCSW [phone]

## 2017-09-16 NOTE — Care Management Important Message (Signed)
Important Message  Patient Details  Name: Kelsey Hebert MRN: [id number] Date of Birth: Mar 03, 1936   Medicare Important Message Given:  Yes    Kerin Salen 09/16/2017, 12:13 [place] Message  Patient Details  Name: Kelsey Hebert MRN: [id number] Date of Birth: [birth year]   Medicare Important Message Given:  Yes    Kerin Salen 09/16/2017, 12:13 PM

## 2017-09-16 NOTE — Care Management Note (Signed)
Case Management Note  Patient Details  Name: [name] MRN: [id number] Date of Birth: 1936/02/09  Subjective/Objective:   82 yo admitted with Hypernatremia. Hx of Liver cirrhosiswith chronic liver failure   Action/Plan: CM consult for home with hospice. This CM met with pt husband at bedside to offer choice for home hospice services. [facility] chosen and [facility] referral center called for referral. CM will continue to follow.  Expected Discharge Date:  09/13/17               Expected Discharge Plan:  Home w Hospice Care  In-House Referral:     Discharge planning Services  CM Consult  Post Acute Care Choice:  Hospice Choice offered to:  Spouse  DME Arranged:    DME Agency:     HH Arranged:  Disease Management [place] Agency:  [facility]  Status of Service:  In process, will continue to follow  If discussed at Long Length of Stay Meetings, dates discussed:    Additional CommentsLynnell Catalan, RN 09/16/2017, 10:32 AM [phone]

## 2017-09-16 NOTE — Progress Notes (Addendum)
Pt has not voided in 6 hours. Bladder scan revealed retention of greater than 700cc. MD Maylene Roes consulted and ordered that a foley be placed versus in and out cath given pt's decline.

## 2017-09-16 NOTE — Progress Notes (Signed)
CRITICAL VALUE ALERT   Critical Value: Glucose 32  Date & Time Notified: 09/16/2017  1068  Provider Notified: Silas [place]  Orders Received: Physician to review and address

## 2017-09-16 NOTE — Progress Notes (Signed)
PT Cancellation Note  Patient Details Name: Kelsey Hebert MRN: [id number] DOB: October 17, 1935   Cancelled Treatment:     Per family , pt asleep this am , deferred PT.  Discussed with RN who agrees. Will check back at another time.   Donato Heinz. Owens Shark, PT  Norwood Levo 09/16/2017, 11:57 AM

## 2017-09-16 NOTE — Progress Notes (Signed)
PMT progress note  Patient appears weak, she is resting in bed, appears pale. Husband not currently at bedside   BP 110/75 (BP Location: Left Arm)   Pulse 78   Temp 98.2 F (36.8 C) (Oral)   Resp 12   Ht 5\' 4"  (1.626 m)   Wt 36.7 kg (81 lb)   SpO2 (!) 86%   BMI 13.90 kg/m  Labs and imaging noted.  CT with cirrhosis Has thrombocytopenia    Awake and appears pale, weak Thin weak frail Appears chronically ill No edema Has muscle wasting   Abdomen appears to have hernia, mild distension evident   PPS 20-30%  A/P: Generalized deconditioning, weight loss, failure to thrive Acute delirium   low platelets Cirrhosis liver Transaminitis.   Unclear if there is an underlying malignancy. Regardless, the patient has ongoing decline, she appears frail weak, agree with DNR, would recommend home with hospice, how ever, follow hospital course, if patient has ongoing decline, she may become eligible for residential hospice.    15 minutes spent [facility] palliative medicine team [phone]

## 2017-09-17 DIAGNOSIS — R740 Nonspecific elevation of levels of transaminase and lactic acid dehydrogenase [LDH]: Secondary | ICD-10-CM

## 2017-09-17 DIAGNOSIS — Z7189 Other specified counseling: Secondary | ICD-10-CM

## 2017-09-17 DIAGNOSIS — Z515 Encounter for palliative care: Secondary | ICD-10-CM

## 2017-09-17 DIAGNOSIS — E870 Hyperosmolality and hypernatremia: Secondary | ICD-10-CM

## 2017-09-17 DIAGNOSIS — Z23 Encounter for immunization: Secondary | ICD-10-CM | POA: Diagnosis not present

## 2017-09-17 MED ORDER — ACETAMINOPHEN 650 MG RE SUPP: 650.0000 mg | Freq: Four times a day (QID) | RECTAL | Status: DC | PRN

## 2017-09-17 MED ORDER — GLYCOPYRROLATE 0.2 MG/ML IJ SOLN: 0.2000 mg | INTRAMUSCULAR | Status: DC | PRN

## 2017-09-17 MED ORDER — MORPHINE SULFATE (PF) 2 MG/ML IV SOLN: 1.0000 mg | INTRAVENOUS | Status: DC | PRN

## 2017-09-17 MED ORDER — POLYVINYL ALCOHOL 1.4 % OP SOLN
1.0000 [drp] | Freq: Four times a day (QID) | OPHTHALMIC | Status: DC | PRN
  Filled 2017-09-17: qty 15

## 2017-09-17 MED ORDER — LORAZEPAM 2 MG/ML PO CONC: 1.0000 mg | ORAL | Status: DC | PRN

## 2017-09-17 MED ORDER — GLYCOPYRROLATE 1 MG PO TABS: 1.0000 mg | ORAL_TABLET | ORAL | Status: DC | PRN

## 2017-09-17 MED ORDER — MORPHINE SULFATE (CONCENTRATE) 10 MG/0.5ML PO SOLN: 5.0000 mg | ORAL | Status: DC | PRN

## 2017-09-17 MED ORDER — LORAZEPAM 2 MG/ML IJ SOLN: 1.0000 mg | INTRAMUSCULAR | Status: DC | PRN

## 2017-09-17 MED ORDER — BIOTENE DRY MOUTH MT LIQD: 15.0000 mL | OROMUCOSAL | Status: DC | PRN

## 2017-09-17 MED ORDER — ACETAMINOPHEN 325 MG PO TABS: 650.0000 mg | ORAL_TABLET | Freq: Four times a day (QID) | ORAL | Status: DC | PRN

## 2017-09-17 MED ORDER — LORAZEPAM 1 MG PO TABS: 1.0000 mg | ORAL_TABLET | ORAL | Status: DC | PRN

## 2017-09-18 DIAGNOSIS — R338 Other retention of urine: Secondary | ICD-10-CM

## 2017-09-18 DIAGNOSIS — E162 Hypoglycemia, unspecified: Secondary | ICD-10-CM

## 2017-09-18 DIAGNOSIS — F0390 Unspecified dementia without behavioral disturbance: Secondary | ICD-10-CM

## 2017-09-18 DIAGNOSIS — K7031 Alcoholic cirrhosis of liver with ascites: Secondary | ICD-10-CM

## 2017-09-20 NOTE — Death Summary Note (Signed)
DEATH SUMMARY   Patient Details  Name: Kelsey Hebert MRN: [id number] DOB: [birth year]  Admission/Discharge Information   Admit Date:  September 29, 2017  Date of Death: Date of Death: 10-08-17  Time of Death: Time of Death: 11/08/48  Length of Stay: October 20, 2022  Referring Physician: Orlena Sheldon, PA-C   Reason(s) for Hospitalization  Severe hypernatremia due to dehydration, severe calorie protein malnutrition, failure to thrive  Diagnoses  Preliminary cause of death: Failure to thrive in adult Secondary Diagnoses (including complications and co-morbidities):  Principal Problem:   Hypernatremia Active Problems:   Essential hypertension   Normocytic anemia   Severe protein-calorie malnutrition (HCC)   S/P total hip arthroplasty   Transaminitis   Alcoholic cirrhosis of liver with ascites ([place])   Hypoglycemia   Dementia   Acute urinary retention   Brief Hospital Course (including significant findings, care, treatment, and services provided and events leading to death)  Kelsey Hebert is a 82 y.o. year old female who presented with failure to thrive. She does have a significant past medical history of anemia ofchronic disease, diverticulosis, hypertension, andhistory of deep vein thrombosis.Patient had a progressive and significant decline on her functional physical capacity for the last 2 months prior to hospitalization;on the day of admission she was extremely weak, unable to get out of her bed without help. She presented with hypernatremia, elevated liver enzymes, and CT abd revealed cirrhosis with large volume ascites, small distal esophageal and gastric varices, gallstones, ventral abdominal hernia. She was treated with IVF with improvement in her sodium levels. Throughout hospitalization, she continued to refuse food and declined significantly. Palliative care discussion lead to hospice consideration. On Oct 08, 2022, she became more unresponsive and was transitioned for comfort care. Patient  expired at [facility] on 08-Oct-2022.    Pertinent Labs and Studies  Significant Diagnostic Studies Ct Abdomen Pelvis W Wo Contrast  Result Date: 09/11/2017 CLINICAL DATA:  Unintentional weight loss. EXAM: CT ABDOMEN AND PELVIS WITHOUT AND WITH CONTRAST TECHNIQUE: Multidetector CT imaging of the abdomen and pelvis was performed following the standard protocol before and following the bolus administration of intravenous contrast. CONTRAST:  22mL ISOVUE-300 IOPAMIDOL (ISOVUE-300) INJECTION 61% COMPARISON:  CT from Sep 29, 2017 and ultrasound from 09/09/2017 FINDINGS: Lower chest: Moderate bilateral pleural effusions. Hepatobiliary: The liver is cirrhotic with a diffusely nodular contour and hypertrophy of the caudate lobe. There are no suspicious arterial phase enhancing lesions identified suggestive of hepatoma. Multiple small stones are identified within the gallbladder. No biliary dilatation. Pancreas: Unremarkable. No pancreatic ductal dilatation or surrounding inflammatory changes. Spleen: The spleen is normal in size and appearance. Adrenals/Urinary Tract: Normal appearance of the adrenal glands. Several left kidney cysts identified. No hydronephrosis or mass. The urinary bladder is largely obscured by beam hardening artifact from right hip arthroplasty device. Stomach/Bowel: Normal appearance of the stomach. No pathologically dilated loops of small bowel identified. No abnormal dilatation of the colon. Diffuse colonic diverticulosis identified. No acute inflammation. Large stool burden identified within the rectum. Vascular/Lymphatic: Aortic atherosclerosis. No aneurysm. The portal vein and hepatic veins appear patent. Small gastric and distal esophageal varices suspected. No adenopathy within the abdomen or pelvis. Reproductive: Poorly visualized due to beam hardening artifact are multiple calcified fibroids within the pelvis. No adnexal mass identified. Other: New large volume of ascites identified compared with  previous exam.Previous mesh repair of ventral abdominal wall hernias identified. Evidence of hernia recurrence within the ventral lower abdominal wall is identified which contains nonobstructed loops of small and large bowel, image 55 of  series 9. Musculoskeletal: Degenerative disc disease noted within the thoracic and lumbar spine. First degree anterolisthesis of L4 on L5 noted. IMPRESSION: 1. Morphologic features of the liver compatible with cirrhosis. 2. New large volume of ascites within the abdomen and pelvis. 3. Small distal esophageal and gastric varices suspected. 4. Gallstones 5.  Aortic Atherosclerosis (ICD10-I70.0). 6. Ventral abdominal wall hernia recurrence status post mesh repair. This contains nonobstructed loops of small and large bowel. 7. Bilateral pleural effusions. Electronically Signed   By: Kerby Moors M.D.   On: 09/11/2017 16:56   Ct Abdomen Pelvis Wo Contrast  Result Date: 09/04/2017 CLINICAL DATA:  Anemia of chronic disease, diverticulosis, and hypertension. Lack of appetite over the last several months. Weight loss. EXAM: CT ABDOMEN AND PELVIS WITHOUT CONTRAST TECHNIQUE: Multidetector CT imaging of the abdomen and pelvis was performed following the standard protocol without IV contrast. COMPARISON:  July 18, 2017 FINDINGS: Evaluation of the abdomen and pelvis is nearly nondiagnostic due the lack of intravenous contrast, persistent increased attenuation in the subcutaneous and intra-abdominal fat, and the lack of intra-abdominal fat. Lower chest: No acute abnormality. Hepatobiliary: Increased attenuation the gallbladder is consistent with stones or sludge. No acute abnormalities identified in the liver. Pancreas: Very poor evaluation due to the limitations of the study without obvious abnormality. Spleen: Poorly evaluated due to the limitation of the study without obvious abnormality. Adrenals/Urinary Tract: The adrenal glands are not well visualized. The kidneys are poorly  evaluated. There is probably a punctate stone in the lower right kidney on series 4, image 52 measuring 2 or 3 mm. No definitive left-sided stones. No definitive hydronephrosis. Limited views of the bladder are unremarkable. The ureters are not well assessed. Stomach/Bowel: The stomach and small bowel are poorly assessed. The colon is poorly assessed as well. [place] without obvious diverticulitis. The appendix is not visualized. Vascular/Lymphatic: Atherosclerotic change in the nonaneurysmal aorta and branching vessels. Evaluation for adenopathy is limited. Reproductive: The uterus and ovaries are poorly evaluated. Other: Hernia mesh in the lower abdomen.  No free air or free fluid. Musculoskeletal: No acute or significant osseous findings. IMPRESSION: 1. Markedly limited study due the lack of intra-abdominal fat and contrast. 2. Stones or sludge in the gallbladder. 3. Probable punctate stone in the right kidney without obvious hydronephrosis. 4. Atherosclerotic change in the aorta and branching vessels. Electronically Signed   By: Dorise Bullion III M.D   On: 09/15/2017 17:12   Dg Chest Portable 1 View  Result Date: 09/16/2017 CLINICAL DATA:  Failure to thrive. EXAM: PORTABLE CHEST 1 VIEW COMPARISON:  07/18/2017 and 12/05/2015 FINDINGS: Lungs are clear. Cardiomediastinal silhouette is within normal. There are degenerative changes of the spine. IMPRESSION: No active disease. Electronically Signed   By: Marin Olp M.D.   On: 08/30/2017 13:22   US Abdomen Limited Ruq  Result Date: 09/09/2017 CLINICAL DATA:  Anorexia EXAM: ULTRASOUND ABDOMEN LIMITED RIGHT UPPER QUADRANT COMPARISON:  Noncontrast CT from 09/16/2017. FINDINGS: Gallbladder: Numerous layering gallstones are noted without secondary signs of acute cholecystitis. The largest gallstone was approximately 8 mm. No pericholecystic fluid or mural thickening of the gallbladder is noted. No sonographic Murphy sign noted by sonographer. Common bile  duct: Diameter: 3.3 mm and normal. Liver: Small amount of ascites is noted about the liver. Morphologic appearance of cirrhosis. The liver parenchyma is predominantly echogenic with area of hypoechogenicity anteriorly measuring 4.2 x 3.3 x 2.9 cm that is more likely an area focal fatty sparing as opposed to an underlying lesion. CT with  without and with IV contrast may help for better assessment. Trace intrahepatic biliary dilatation. IMPRESSION: 1. Uncomplicated cholelithiasis. 2. Echogenic liver parenchyma with area of focal fatty sparing suspected measuring 4.2 x 3.3 x 2.9 cm. A subtle underlying hepatic lesion is believed less likely but cannot be entirely excluded. Consider nonemergent cross-sectional imaging without and with IV contrast using a liver lesion protocol for better assessment. 3. Slight undulating surface contour of the liver morphologically compatible with cirrhosis. Small volume of ascites. Electronically Signed   By: Ashley Royalty M.D.   On: 09/09/2017 14:57    Microbiology Recent Results (from the past 240 hour(s))  Urine culture     Status: Abnormal   Collection Time: 09/10/17  1:08 AM  Result Value Ref Range Status   Specimen Description   Final    URINE, RANDOM Performed at [facility] [address] [place], [place] [address]    Special Requests   Final    NONE Performed at [facility], [place] [address], [place], [place] [address]    Culture MULTIPLE SPECIES PRESENT, SUGGEST RECOLLECTION (A)  Final   Report Status 09/11/2017 FINAL  Final    Lab Basic Metabolic Panel: Recent Labs  Lab 09/12/17 0414 09/13/17 0338 09/14/17 0417 09/15/17 0440 09/16/17 0353  NA 147* 147* 142 144 142  K 4.3 3.9 3.9 4.4 4.0  CL 114* 113* 109 110 112*  CO2 25 26 26 24  21*  GLUCOSE 96 67 68 62* 32*  BUN 51* 39* 35* 30* 34*  CREATININE 0.83 0.65 0.74 0.62 0.55  CALCIUM 8.5* 8.3* 7.9* 8.1* 7.9*  MG 2.3  --   --   --   --   PHOS 1.9*  --   --    --   --    Liver Function Tests: Recent Labs  Lab 09/12/17 0414 09/13/17 0338  AST 504* 325*  ALT 431* 319*  ALKPHOS 282* 237*  BILITOT 0.5 0.6  PROT 5.9* 5.5*  ALBUMIN 2.7* 2.5*   No results for input(s): LIPASE, AMYLASE in the last 168 hours. Recent Labs  Lab 09/12/17 1150 09/16/17 0353  AMMONIA 19 30   CBC: Recent Labs  Lab 09/12/17 0414 09/13/17 0338  WBC 5.4 4.7  HGB 14.5 13.2  HCT 42.9 40.1  MCV 105.4* 106.1*  PLT 75* 57*   Cardiac Enzymes: No results for input(s): CKTOTAL, CKMB, CKMBINDEX, TROPONINI in the last 168 hours. Sepsis Labs: Recent Labs  Lab 09/12/17 0414 09/13/17 0338  WBC 5.4 4.7    Procedures/Operations  None   Dessa Phi 09/18/2017, 9:25 AM

## 2017-09-20 NOTE — Progress Notes (Signed)
PMT progress note  Patient somnolent.  She is actively dying.  Met with husband and son at bedside.  They understand situation and state that they wish they could take her home, however, they will be unable to care for her in her current state at home.  Hopeful for [facility] for EOL care.   BP 110/60 (BP Location: Left Arm)   Pulse 71   Temp 98.2 F (36.8 C) (Oral)   Resp 14   Ht _0  (1.626 m)   Wt 36.7 kg (81 lb)   SpO2 92%   BMI 13.90 kg/m  Labs and imaging noted.  CT with cirrhosis Has thrombocytopenia    Somnolent Thin weak frail Appears chronically ill No edema Has muscle wasting   Abdomen appears to have hernia, mild distension evident   PPS 10  A/P: Generalized deconditioning, weight loss, failure to thrive Acute delirium   low platelets Cirrhosis liver Transaminitis. Actively dying   Patient is now somnolent and is actively dying.  We discussed comfort care and plan for transition to residential hospice at [facility] when bed is available.  I do not see other options for discharge other than residential hospice and it appears likely based on exam today that she may have a hospital death.  Will continue to evaluate daily and determine stability for transport when bed becomes available.  I added on PRN morphine, ativan, and robinul for comfort.   Total time: 30 minutes Greater than 50%  of this time was spent counseling and coordinating care related to the above assessment and plan.  Micheline Rough, MD [place] Team [phone]

## 2017-09-20 NOTE — Progress Notes (Signed)
Patient time of death occurred at 78. Husband at the bedside, son called and notified. TRH notified as well

## 2017-09-20 NOTE — Progress Notes (Signed)
[facility]   Received update from [facility] that family now requesting [facility] instead of home. Unfortunately [facility] does not have availability for Kelsey Hebert today. Have made RNCM and CSW both aware. Will continue to follow until disposition determined.   Thank you,  Erling Conte, LCSW [phone]

## 2017-09-20 NOTE — Progress Notes (Signed)
Pt expired at 1950 hrs, pronounced by 2 RNs. Death was expected and pt was DNR/comfort care only. Death certificate completed and given to RN. KJKG, NP [facility]

## 2017-09-20 NOTE — Progress Notes (Signed)
PROGRESS NOTE    Kelsey Hebert  TDD:[id number] DOB: 1936-06-20 DOA: 09/10/2017 PCP: Orlena Sheldon, PA-C     Brief Narrative:  Kelsey Hebert is 82 year old female who presented with failure to thrive. She does have a significant past medical history of anemia of chronic disease, diverticulosis, hypertension, andhistory of deep vein thrombosis.Patient had a progressive and significant decline on her functional physical capacity for the last 2 months prior to hospitalization;on the day of admission she was extremely weak, unable to get out of her bed without help. She presented with hypernatremia, elevated liver enzymes, and CT abd revealed cirrhosis with large volume ascites, small distal esophageal and gastric varices, gallstones, ventral abdominal hernia.   Assessment & Plan:   Principal Problem:   Hypernatremia Active Problems:   Essential hypertension   Normocytic anemia   Severe protein-calorie malnutrition (HCC)   S/P total hip arthroplasty   Transaminitis   Severe hypernatremia due to dehydration -Hypernatremia resolved   Liver cirrhosiswith chronic liver failure, pancytopenia -Ammonia level is normal -Hepatitis panel negative   Hypoglycemia -Due to poor PO intake, resolved   Dementia -Unresponsive today   Severe calorie protein malnutrition -Encourage PO intake, patient has been refusing meals   Acute urinary retention -Foley    DVT prophylaxis: lovenox Code Status: DNR Family Communication: husband at bedside Disposition Plan: Discussed with husband regarding patient's progressive decline. Today, she is not responsive to voice. She is actively dying. He had hoped to take patient home with home hospice but at this point, I believe she really needs to be placed at residential hospice. Husband agreeable. Awaiting residential hospice availability.    Consultants:   Palliative care  Procedures:   None   Antimicrobials:  Anti-infectives (From  admission, onward)   Start     Dose/Rate Route Frequency Ordered Stop   09/10/17 1100  cefTRIAXone (ROCEPHIN) 1 g in sodium chloride 0.9 % 100 mL IVPB  Status:  Discontinued     1 g 200 mL/hr over 30 Minutes Intravenous Every 24 hours 09/10/17 1025 09/11/17 1315       Subjective: Patient unresponsive today.    Objective: Vitals:   09/16/17 1332 09/16/17 1856 09/16/17 2100 10/11/2017 0540  BP: 110/75  114/68 110/60  Pulse: 78 68 62 71  Resp: 12  16 14   Temp:      TempSrc:    Other (Comment)  SpO2: (!) 86% 94% 95% 92%  Weight:      Height:        Intake/Output Summary (Last 24 hours) at 10/11/2017 1151 Last data filed at 2017-10-11 0948 Gross per 24 hour  Intake 0 ml  Output 1100 ml  Net -1100 ml   Filed Weights   09/10/17 1700  Weight: 36.7 kg (81 lb)    Examination: General exam: Appears comfortable, unresponsive to voice today  Respiratory system: Clear to auscultation. Respiratory effort normal. Cardiovascular system: S1 & S2 heard, RRR. No JVD, murmurs, rubs, gallops or clicks. No pedal edema. Gastrointestinal system: Abdomen is nondistended, soft and nontender. No organomegaly or masses felt. Normal bowel sounds heard. Central nervous system: Somnolent  Extremities: Symmetric Skin: No rashes, lesions or ulcers   Data Reviewed: I have personally reviewed following labs and imaging studies  CBC: Recent Labs  Lab 09/11/17 0410 09/12/17 0414 09/13/17 0338  WBC 5.1 5.4 4.7  HGB 13.5 14.5 13.2  HCT 40.3 42.9 40.1  MCV 107.2* 105.4* 106.1*  PLT 63* 75* 57*   Basic Metabolic Panel: Recent  Labs  Lab 09/11/17 0410 09/12/17 0414 09/13/17 0338 09/14/17 0417 09/15/17 0440 09/16/17 0353  NA 148* 147* 147* 142 144 142  K 3.3* 4.3 3.9 3.9 4.4 4.0  CL 115* 114* 113* 109 110 112*  CO2 26 25 26 26 24  21*  GLUCOSE 158* 96 67 68 62* 32*  BUN 66* 51* 39* 35* 30* 34*  CREATININE 0.92 0.83 0.65 0.74 0.62 0.55  CALCIUM 8.1* 8.5* 8.3* 7.9* 8.1* 7.9*  MG 2.4 2.3   --   --   --   --   PHOS 1.7* 1.9*  --   --   --   --    GFR: Estimated Creatinine Clearance: 32.5 mL/min (by C-G formula based on SCr of 0.55 mg/dL). Liver Function Tests: Recent Labs  Lab 09/11/17 0410 09/12/17 0414 09/13/17 0338  AST 820* 504* 325*  ALT 504* 431* 319*  ALKPHOS 302* 282* 237*  BILITOT 0.4 0.5 0.6  PROT 5.7* 5.9* 5.5*  ALBUMIN 2.6* 2.7* 2.5*   No results for input(s): LIPASE, AMYLASE in the last 168 hours. Recent Labs  Lab 09/12/17 1150 09/16/17 0353  AMMONIA 19 30   Coagulation Profile: Recent Labs  Lab 09/12/17 0414  INR 1.15   Cardiac Enzymes: No results for input(s): CKTOTAL, CKMB, CKMBINDEX, TROPONINI in the last 168 hours. BNP (last 3 results) No results for input(s): PROBNP in the last 8760 hours. HbA1C: No results for input(s): HGBA1C in the last 72 hours. CBG: Recent Labs  Lab 09/16/17 0839  GLUCAP 77   Lipid Profile: No results for input(s): CHOL, HDL, LDLCALC, TRIG, CHOLHDL, LDLDIRECT in the last 72 hours. Thyroid Function Tests: No results for input(s): TSH, T4TOTAL, FREET4, T3FREE, THYROIDAB in the last 72 hours. Anemia Panel: No results for input(s): VITAMINB12, FOLATE, FERRITIN, TIBC, IRON, RETICCTPCT in the last 72 hours. Sepsis Labs: No results for input(s): PROCALCITON, LATICACIDVEN in the last 168 hours.  Recent Results (from the past 240 hour(s))  Urine culture     Status: Abnormal   Collection Time: 09/10/17  1:08 AM  Result Value Ref Range Status   Specimen Description   Final    URINE, RANDOM Performed at [facility] [address] [place], [place] [address]    Special Requests   Final    NONE Performed at [facility], [place] [address], [place], [place] [address]    Culture MULTIPLE SPECIES PRESENT, SUGGEST RECOLLECTION (A)  Final   Report Status 09/11/2017 FINAL  Final       Radiology Studies: No results found.    Scheduled Meds: . feeding supplement (ENSURE  ENLIVE)  237 mL Oral TID BM  . feeding supplement (PRO-STAT SUGAR FREE 64)  30 mL Oral TID WC  . lactulose  30 g Oral TID  . mirtazapine  15 mg Oral QHS  . multivitamin  15 mL Oral Daily   Continuous Infusions: . dextrose 75 mL/hr at 09/14/17 2205     LOS: 9 days    Time spent: 30 minutes   Dessa Phi, DO [facility] [email or web address] Password [facility] 2017-10-06, 11:51 AM

## 2017-09-20 NOTE — Progress Notes (Signed)
Met with pt and husband at bedside today to discuss plans. Pt not alert, but husband states that "we wanted to take her home with hospice, but now I'm being told she may have even less time than we thought, maybe more like a week. I don't think I can keep her comfortable like that at home." Husband met with [facility] yesterday and states he prefers [facility]. CSW made referral but no current bed availability. Husband states he is not open to other hospice facilities. Updated attending, will re-address tomorrow.   Sharren Bridge, MSW, LCSW Clinical Social Work 03-Oct-2017 [phone]

## 2017-09-20 DEATH — deceased

## 2020-01-01 IMAGING — DX DG CHEST 1V PORT
1 series · 1 of 1 positions shown · non-contrast
Comparison: 07/18/2017 and 12/05/2015

CLINICAL DATA: Failure to thrive.

EXAM:
PORTABLE CHEST 1 VIEW

[chest ap]
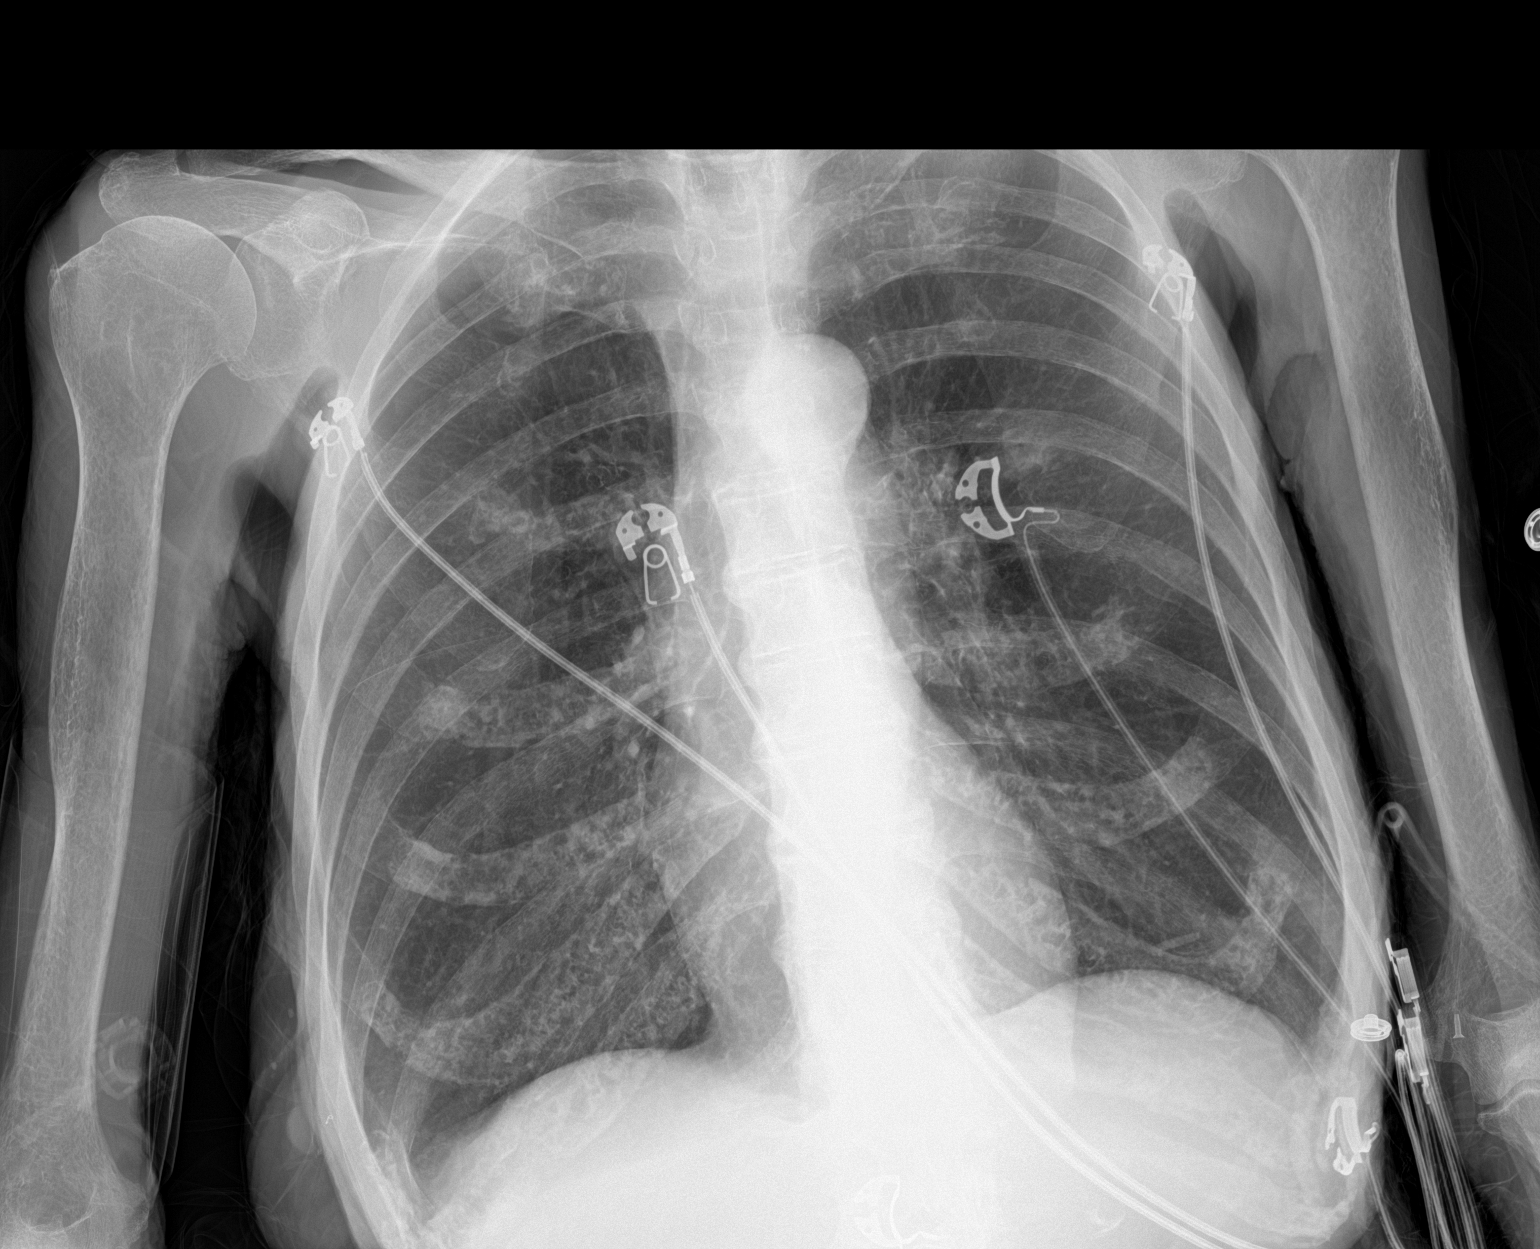

[1 of 1 positions shown; findings below may reference images not displayed]

FINDINGS: Lungs are clear. Cardiomediastinal silhouette is within normal.
There are degenerative changes of the spine.
IMPRESSION: No active disease.

## 2020-01-04 IMAGING — CT CT ABD-PEL WO/W CM
2 of 9 series · 13 of 46 positions shown, 18 images · IV contrast (iopamidol)
Comparison: CT from 09/08/2017 and ultrasound from 09/09/2017

CLINICAL DATA: Unintentional weight loss.

EXAM:
CT ABDOMEN AND PELVIS WITHOUT AND WITH CONTRAST
TECHNIQUE: Multidetector CT imaging of the abdomen and pelvis was performed
following the standard protocol before and following the bolus
administration of intravenous contrast.
CONTRAST:  80mL KSKHMM-SZZ IOPAMIDOL (KSKHMM-SZZ) INJECTION 61%

[Series 7: axial venous · axial · portal-venous · 0.72mm/px · z∈[-792,-446]mm · 10 of 139 slices shown, 15 images]
[im 12/139  soft-tissue]
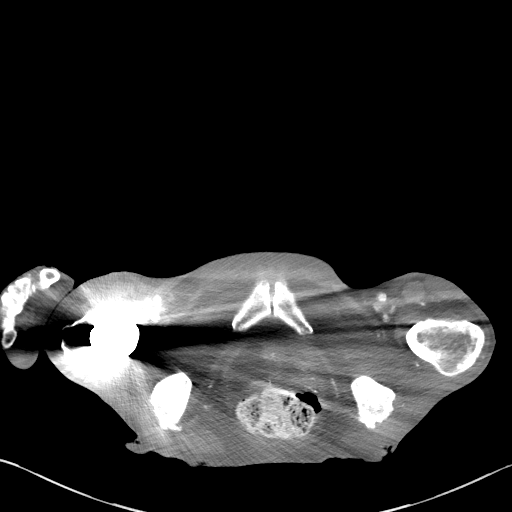
[im 12/139  bone]
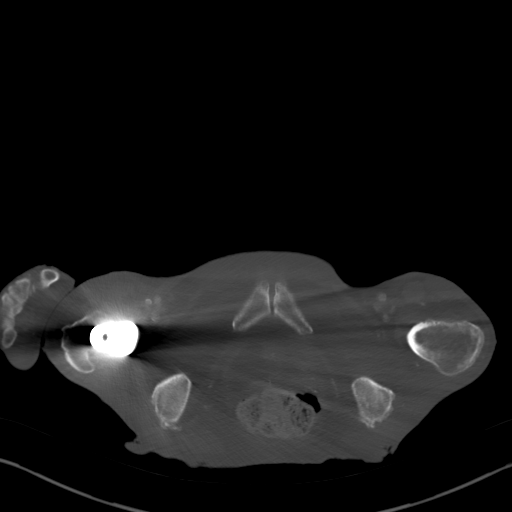
[im 24/139  soft-tissue]
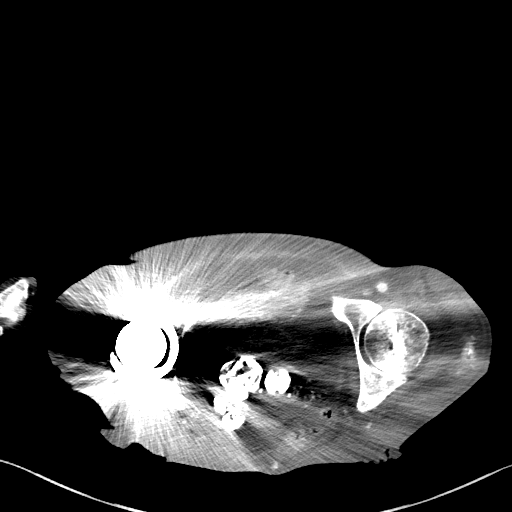
[im 47/139  soft-tissue]
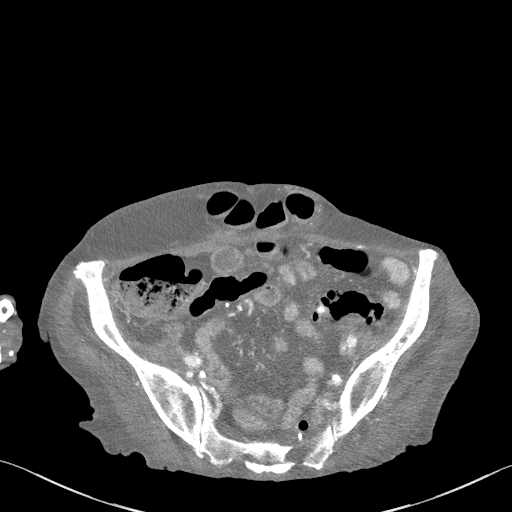
[im 58/139  soft-tissue]
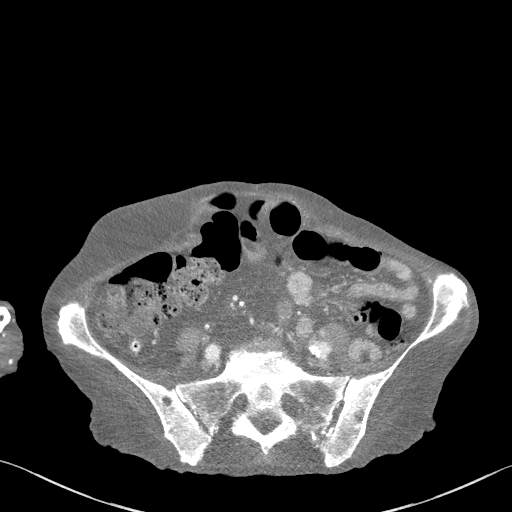
[im 70/139  soft-tissue]
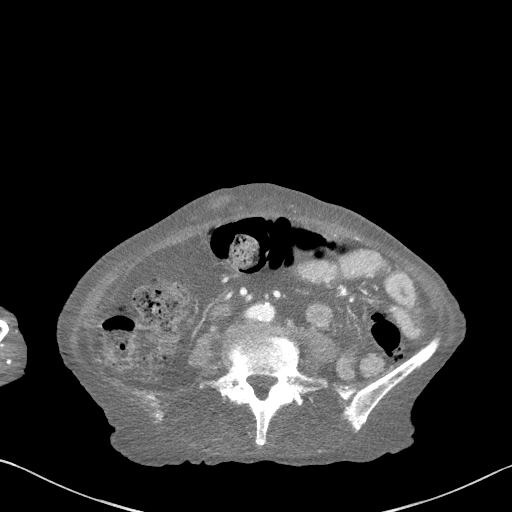
[im 81/139  soft-tissue]
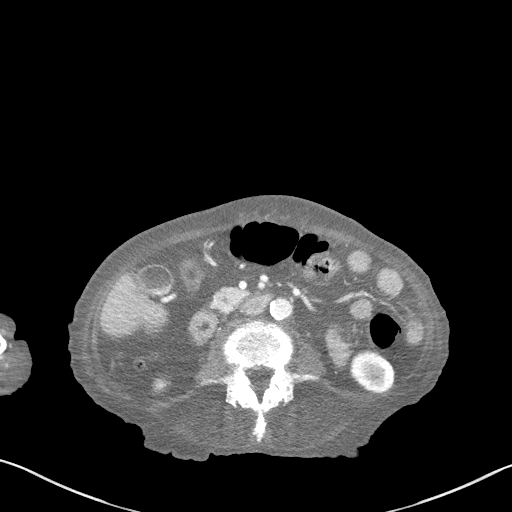
[im 93/139  soft-tissue]
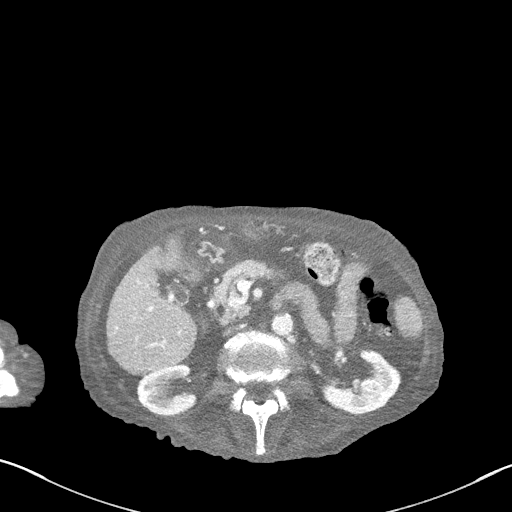
[im 93/139  lung]
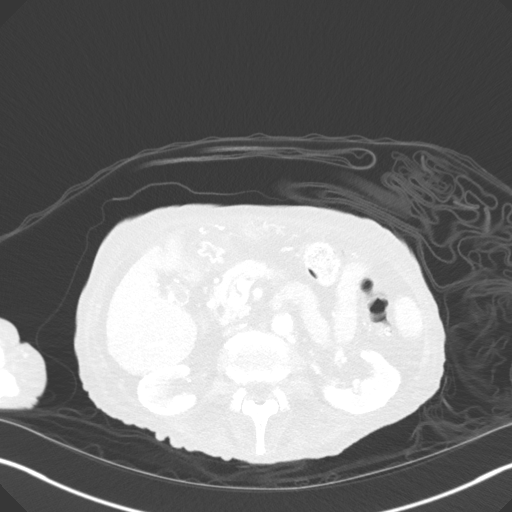
[im 104/139  lung]
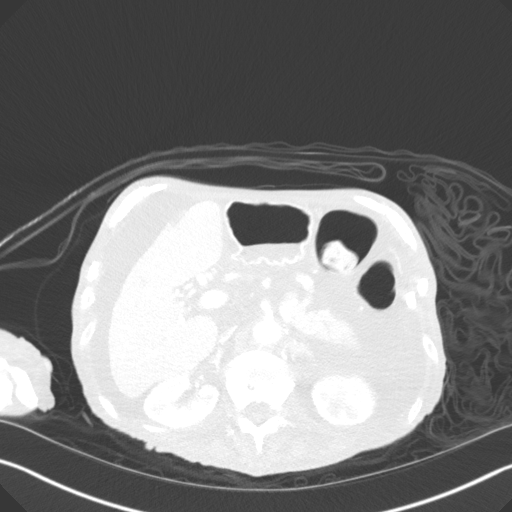
[im 116/139  soft-tissue]
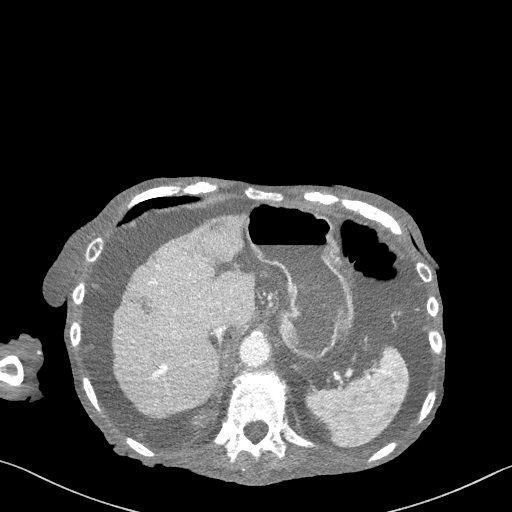
[im 116/139  lung]
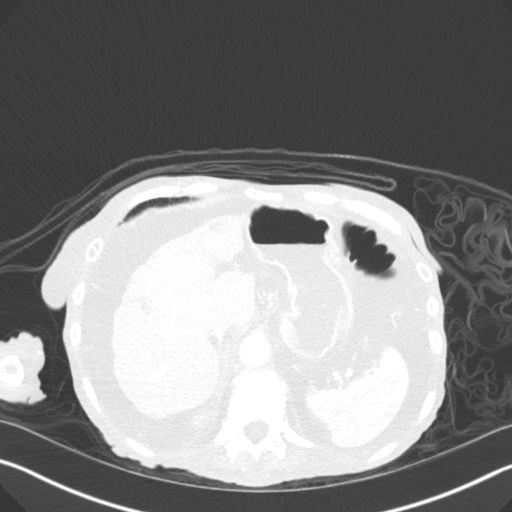
[im 127/139  soft-tissue]
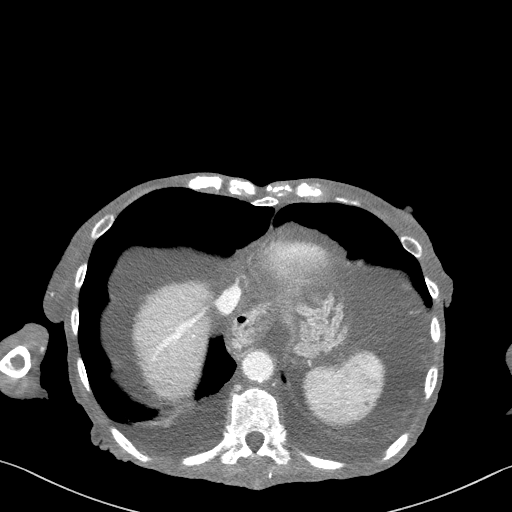
[im 127/139  lung]
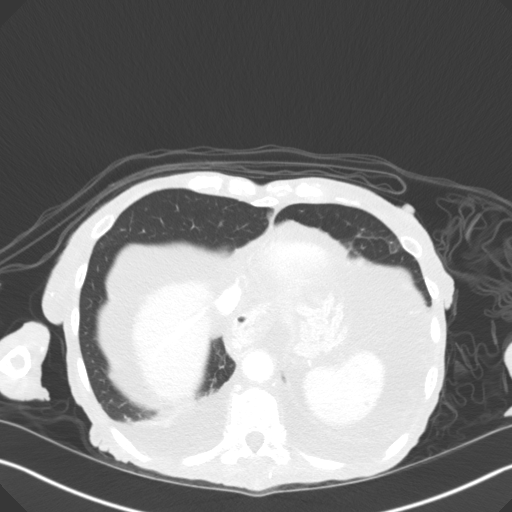
[im 127/139  bone]
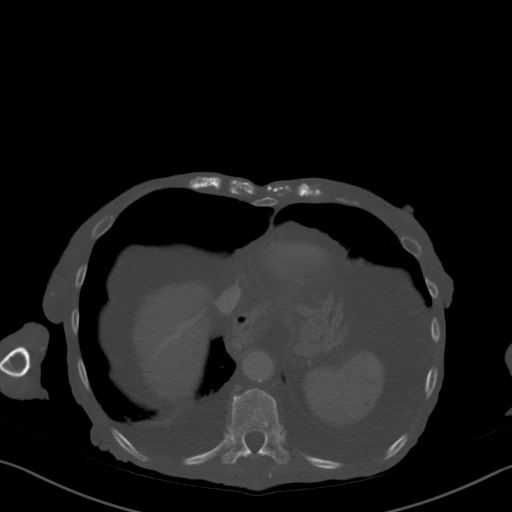

[Series 8: coronal venous · coronal · portal-venous · 0.78mm/px · 3 of 73 slices shown]
[im 19/73  soft-tissue]
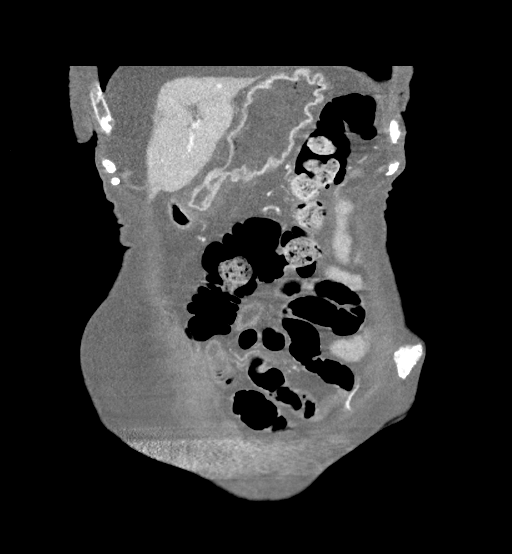
[im 37/73  soft-tissue]
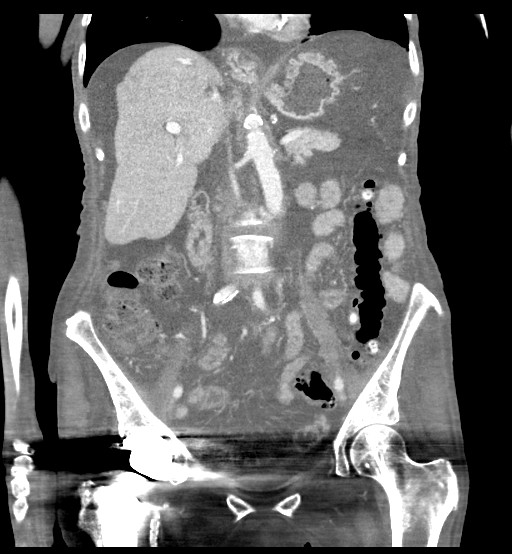
[im 55/73  soft-tissue]
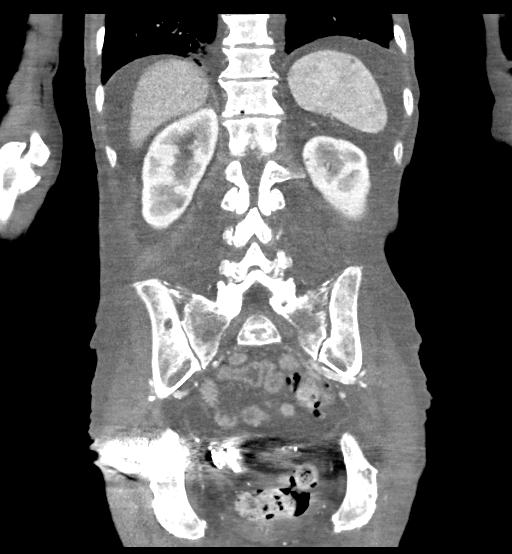

[13 of 46 positions shown; findings below may reference images not displayed]

FINDINGS: Lower chest: Moderate bilateral pleural effusions.

Hepatobiliary: The liver is cirrhotic with a diffusely nodular
contour and hypertrophy of the caudate lobe. There are no suspicious
arterial phase enhancing lesions identified suggestive of hepatoma.
Multiple small stones are identified within the gallbladder. No
biliary dilatation.

Pancreas: Unremarkable. No pancreatic ductal dilatation or
surrounding inflammatory changes.

Spleen: The spleen is normal in size and appearance.

Adrenals/Urinary Tract: Normal appearance of the adrenal glands.
Several left kidney cysts identified. No hydronephrosis or mass. The
urinary bladder is largely obscured by beam hardening artifact from
right hip arthroplasty device.

Stomach/Bowel: Normal appearance of the stomach. No pathologically
dilated loops of small bowel identified. No abnormal dilatation of
the colon. Diffuse colonic diverticulosis identified. No acute
inflammation. Large stool burden identified within the rectum.

Vascular/Lymphatic: Aortic atherosclerosis. No aneurysm. The portal
vein and hepatic veins appear patent. Small gastric and distal
esophageal varices suspected. No adenopathy within the abdomen or
pelvis.

Reproductive: Poorly visualized due to beam hardening artifact are
multiple calcified fibroids within the pelvis. No adnexal mass
identified.

Other: New large volume of ascites identified compared with previous
exam.Previous mesh repair of ventral abdominal wall hernias
identified. Evidence of hernia recurrence within the ventral lower
abdominal wall is identified which contains nonobstructed loops of
small and large bowel, image 55 of series 9.

Musculoskeletal: Degenerative disc disease noted within the thoracic
and lumbar spine. First degree anterolisthesis of L4 on L5 noted.
IMPRESSION: 1. Morphologic features of the liver compatible with cirrhosis.
2. New large volume of ascites within the abdomen and pelvis.
3. Small distal esophageal and gastric varices suspected.
4. Gallstones
5.  Aortic Atherosclerosis ([id number]).
6. Ventral abdominal wall hernia recurrence status post mesh repair.
This contains nonobstructed loops of small and large bowel.
7. Bilateral pleural effusions.
